# Patient Record
Sex: Male | Born: 1935 | Race: White | Hispanic: No
Health system: Southern US, Community
[De-identification: ages and names within clinical notes are randomized; demographics above are authoritative.]

---

## 1998-02-16 ENCOUNTER — Encounter (HOSPITAL_COMMUNITY): Admission: RE | Admit: 1998-02-16 | Discharge: 1998-05-17 | Payer: Self-pay

## 1998-02-22 ENCOUNTER — Encounter: Admission: RE | Admit: 1998-02-22 | Discharge: 1998-02-22 | Payer: Self-pay | Admitting: Family Medicine

## 1998-03-15 ENCOUNTER — Encounter: Admission: RE | Admit: 1998-03-15 | Discharge: 1998-03-15 | Payer: Self-pay | Admitting: Sports Medicine

## 1998-03-29 ENCOUNTER — Encounter: Admission: RE | Admit: 1998-03-29 | Discharge: 1998-03-29 | Payer: Self-pay | Admitting: Family Medicine

## 1998-05-18 ENCOUNTER — Encounter: Admission: RE | Admit: 1998-05-18 | Discharge: 1998-05-18 | Payer: Self-pay | Admitting: Family Medicine

## 1998-06-01 ENCOUNTER — Encounter: Admission: RE | Admit: 1998-06-01 | Discharge: 1998-06-01 | Payer: Self-pay | Admitting: Family Medicine

## 1998-10-26 ENCOUNTER — Encounter: Admission: RE | Admit: 1998-10-26 | Discharge: 1998-10-26 | Payer: Self-pay | Admitting: Family Medicine

## 1998-11-02 ENCOUNTER — Encounter: Admission: RE | Admit: 1998-11-02 | Discharge: 1998-11-02 | Payer: Self-pay | Admitting: Family Medicine

## 1998-11-21 ENCOUNTER — Encounter: Admission: RE | Admit: 1998-11-21 | Discharge: 1998-11-21 | Payer: Self-pay | Admitting: Family Medicine

## 1998-11-28 ENCOUNTER — Encounter: Admission: RE | Admit: 1998-11-28 | Discharge: 1998-11-28 | Payer: Self-pay | Admitting: Family Medicine

## 2000-06-11 ENCOUNTER — Encounter: Admission: RE | Admit: 2000-06-11 | Discharge: 2000-06-11 | Payer: Self-pay | Admitting: Family Medicine

## 2000-06-26 ENCOUNTER — Encounter: Admission: RE | Admit: 2000-06-26 | Discharge: 2000-06-26 | Payer: Self-pay | Admitting: Family Medicine

## 2000-09-01 ENCOUNTER — Encounter: Payer: Self-pay | Admitting: *Deleted

## 2000-09-02 ENCOUNTER — Inpatient Hospital Stay (HOSPITAL_COMMUNITY): Admission: EM | Admit: 2000-09-02 | Discharge: 2000-09-20 | Payer: Self-pay | Admitting: *Deleted

## 2000-09-06 ENCOUNTER — Encounter: Payer: Self-pay | Admitting: Thoracic Surgery (Cardiothoracic Vascular Surgery)

## 2000-09-10 ENCOUNTER — Encounter: Payer: Self-pay | Admitting: Sports Medicine

## 2000-09-10 ENCOUNTER — Encounter: Payer: Self-pay | Admitting: Thoracic Surgery (Cardiothoracic Vascular Surgery)

## 2000-09-11 ENCOUNTER — Encounter: Payer: Self-pay | Admitting: Thoracic Surgery (Cardiothoracic Vascular Surgery)

## 2000-09-12 ENCOUNTER — Encounter: Payer: Self-pay | Admitting: Thoracic Surgery (Cardiothoracic Vascular Surgery)

## 2000-09-13 ENCOUNTER — Encounter: Payer: Self-pay | Admitting: Thoracic Surgery (Cardiothoracic Vascular Surgery)

## 2001-06-23 ENCOUNTER — Emergency Department (HOSPITAL_COMMUNITY): Admission: EM | Admit: 2001-06-23 | Discharge: 2001-06-23 | Payer: Self-pay | Admitting: Emergency Medicine

## 2001-06-25 ENCOUNTER — Encounter: Admission: RE | Admit: 2001-06-25 | Discharge: 2001-06-25 | Payer: Self-pay | Admitting: Family Medicine

## 2001-07-08 ENCOUNTER — Encounter: Admission: RE | Admit: 2001-07-08 | Discharge: 2001-07-08 | Payer: Self-pay | Admitting: Family Medicine

## 2001-08-11 ENCOUNTER — Encounter: Admission: RE | Admit: 2001-08-11 | Discharge: 2001-08-11 | Payer: Self-pay | Admitting: Family Medicine

## 2001-10-12 ENCOUNTER — Encounter: Admission: RE | Admit: 2001-10-12 | Discharge: 2001-10-12 | Payer: Self-pay | Admitting: Sports Medicine

## 2002-06-28 ENCOUNTER — Encounter: Payer: Self-pay | Admitting: Sports Medicine

## 2002-06-28 ENCOUNTER — Inpatient Hospital Stay (HOSPITAL_COMMUNITY): Admission: AD | Admit: 2002-06-28 | Discharge: 2002-07-04 | Payer: Self-pay | Admitting: Sports Medicine

## 2002-06-28 ENCOUNTER — Encounter: Admission: RE | Admit: 2002-06-28 | Discharge: 2002-06-28 | Payer: Self-pay | Admitting: Family Medicine

## 2002-06-29 ENCOUNTER — Encounter: Payer: Self-pay | Admitting: Cardiology

## 2002-07-19 ENCOUNTER — Encounter: Admission: RE | Admit: 2002-07-19 | Discharge: 2002-07-19 | Payer: Self-pay | Admitting: Family Medicine

## 2002-08-09 ENCOUNTER — Encounter: Admission: RE | Admit: 2002-08-09 | Discharge: 2002-08-09 | Payer: Self-pay | Admitting: Family Medicine

## 2002-08-30 ENCOUNTER — Encounter: Admission: RE | Admit: 2002-08-30 | Discharge: 2002-08-30 | Payer: Self-pay | Admitting: Family Medicine

## 2002-09-19 ENCOUNTER — Encounter (INDEPENDENT_AMBULATORY_CARE_PROVIDER_SITE_OTHER): Payer: Self-pay | Admitting: *Deleted

## 2002-09-19 ENCOUNTER — Ambulatory Visit (HOSPITAL_COMMUNITY): Admission: RE | Admit: 2002-09-19 | Discharge: 2002-09-19 | Payer: Self-pay | Admitting: Gastroenterology

## 2002-10-26 ENCOUNTER — Encounter: Admission: RE | Admit: 2002-10-26 | Discharge: 2002-10-26 | Payer: Self-pay | Admitting: Family Medicine

## 2002-11-28 ENCOUNTER — Encounter: Admission: RE | Admit: 2002-11-28 | Discharge: 2002-11-28 | Payer: Self-pay | Admitting: Family Medicine

## 2002-12-08 ENCOUNTER — Encounter: Admission: RE | Admit: 2002-12-08 | Discharge: 2002-12-08 | Payer: Self-pay | Admitting: Sports Medicine

## 2003-01-10 ENCOUNTER — Encounter: Admission: RE | Admit: 2003-01-10 | Discharge: 2003-01-10 | Payer: Self-pay | Admitting: Family Medicine

## 2003-01-17 ENCOUNTER — Encounter: Admission: RE | Admit: 2003-01-17 | Discharge: 2003-01-17 | Payer: Self-pay | Admitting: Family Medicine

## 2004-06-02 ENCOUNTER — Inpatient Hospital Stay (HOSPITAL_COMMUNITY): Admission: EM | Admit: 2004-06-02 | Discharge: 2004-06-08 | Payer: Self-pay | Admitting: Emergency Medicine

## 2004-06-03 ENCOUNTER — Encounter (INDEPENDENT_AMBULATORY_CARE_PROVIDER_SITE_OTHER): Payer: Self-pay | Admitting: *Deleted

## 2004-06-18 ENCOUNTER — Encounter: Admission: RE | Admit: 2004-06-18 | Discharge: 2004-06-18 | Payer: Self-pay | Admitting: Sports Medicine

## 2004-06-28 ENCOUNTER — Encounter: Admission: RE | Admit: 2004-06-28 | Discharge: 2004-06-28 | Payer: Self-pay | Admitting: Family Medicine

## 2005-09-11 ENCOUNTER — Emergency Department (HOSPITAL_COMMUNITY): Admission: EM | Admit: 2005-09-11 | Discharge: 2005-09-11 | Payer: Self-pay | Admitting: Emergency Medicine

## 2005-09-16 ENCOUNTER — Emergency Department (HOSPITAL_COMMUNITY): Admission: EM | Admit: 2005-09-16 | Discharge: 2005-09-16 | Payer: Self-pay | Admitting: Emergency Medicine

## 2005-09-18 ENCOUNTER — Ambulatory Visit (HOSPITAL_COMMUNITY): Admission: RE | Admit: 2005-09-18 | Discharge: 2005-09-18 | Payer: Self-pay | Admitting: Orthopedic Surgery

## 2006-07-27 ENCOUNTER — Ambulatory Visit: Payer: Self-pay | Admitting: Sports Medicine

## 2006-09-21 ENCOUNTER — Ambulatory Visit: Payer: Self-pay | Admitting: Family Medicine

## 2006-09-30 ENCOUNTER — Ambulatory Visit: Payer: Self-pay | Admitting: Family Medicine

## 2007-08-17 ENCOUNTER — Encounter: Payer: Self-pay | Admitting: Family Medicine

## 2007-08-24 ENCOUNTER — Encounter: Payer: Self-pay | Admitting: Family Medicine

## 2007-08-25 ENCOUNTER — Telehealth: Payer: Self-pay | Admitting: *Deleted

## 2008-02-08 ENCOUNTER — Encounter: Payer: Self-pay | Admitting: Family Medicine

## 2008-08-08 ENCOUNTER — Encounter: Payer: Self-pay | Admitting: Family Medicine

## 2009-09-03 ENCOUNTER — Ambulatory Visit: Payer: Self-pay | Admitting: Family Medicine

## 2009-09-03 DIAGNOSIS — I4891 Unspecified atrial fibrillation: Secondary | ICD-10-CM | POA: Insufficient documentation

## 2009-09-03 DIAGNOSIS — K5909 Other constipation: Secondary | ICD-10-CM | POA: Insufficient documentation

## 2009-09-17 ENCOUNTER — Encounter: Payer: Self-pay | Admitting: Family Medicine

## 2009-12-07 ENCOUNTER — Telehealth: Payer: Self-pay | Admitting: Family Medicine

## 2009-12-07 ENCOUNTER — Ambulatory Visit: Payer: Self-pay | Admitting: Family Medicine

## 2009-12-07 ENCOUNTER — Encounter: Admission: RE | Admit: 2009-12-07 | Discharge: 2009-12-07 | Payer: Self-pay | Admitting: Family Medicine

## 2009-12-27 ENCOUNTER — Telehealth: Payer: Self-pay | Admitting: Family Medicine

## 2010-01-01 ENCOUNTER — Encounter: Payer: Self-pay | Admitting: Family Medicine

## 2010-01-09 ENCOUNTER — Encounter: Payer: Self-pay | Admitting: Family Medicine

## 2010-01-09 ENCOUNTER — Ambulatory Visit: Payer: Self-pay | Admitting: Family Medicine

## 2010-01-09 DIAGNOSIS — R5381 Other malaise: Secondary | ICD-10-CM

## 2010-01-09 DIAGNOSIS — R5383 Other fatigue: Secondary | ICD-10-CM

## 2010-01-11 ENCOUNTER — Telehealth (INDEPENDENT_AMBULATORY_CARE_PROVIDER_SITE_OTHER): Payer: Self-pay | Admitting: *Deleted

## 2010-01-15 ENCOUNTER — Encounter: Payer: Self-pay | Admitting: Family Medicine

## 2010-01-15 DIAGNOSIS — E559 Vitamin D deficiency, unspecified: Secondary | ICD-10-CM | POA: Insufficient documentation

## 2010-01-15 LAB — CONVERTED CEMR LAB
ALT: 12 units/L (ref 0–53)
AST: 16 units/L (ref 0–37)
Albumin: 3.6 g/dL (ref 3.5–5.2)
Alkaline Phosphatase: 69 units/L (ref 39–117)
BUN: 12 mg/dL (ref 6–23)
Basophils Absolute: 0 10*3/uL (ref 0.0–0.1)
Basophils Relative: 0 % (ref 0–1)
CO2: 28 meq/L (ref 19–32)
Calcium: 8.6 mg/dL (ref 8.4–10.5)
Chloride: 104 meq/L (ref 96–112)
Creatinine, Ser: 0.98 mg/dL (ref 0.40–1.50)
Eosinophils Absolute: 0.3 10*3/uL (ref 0.0–0.7)
Eosinophils Relative: 3 % (ref 0–5)
Glucose, Bld: 90 mg/dL (ref 70–99)
HCT: 38.1 % — ABNORMAL LOW (ref 39.0–52.0)
Hemoglobin: 12.2 g/dL — ABNORMAL LOW (ref 13.0–17.0)
Lymphocytes Relative: 15 % (ref 12–46)
Lymphs Abs: 1.5 10*3/uL (ref 0.7–4.0)
MCHC: 32 g/dL (ref 30.0–36.0)
MCV: 98.2 fL (ref 78.0–100.0)
Monocytes Absolute: 0.8 10*3/uL (ref 0.1–1.0)
Monocytes Relative: 9 % (ref 3–12)
Neutro Abs: 7.2 10*3/uL (ref 1.7–7.7)
Neutrophils Relative %: 73 % (ref 43–77)
Platelets: 223 10*3/uL (ref 150–400)
Potassium: 4.6 meq/L (ref 3.5–5.3)
RBC: 3.88 M/uL — ABNORMAL LOW (ref 4.22–5.81)
RDW: 13.5 % (ref 11.5–15.5)
Sodium: 139 meq/L (ref 135–145)
TSH: 2.538 microintl units/mL (ref 0.350–4.500)
Total Bilirubin: 0.3 mg/dL (ref 0.3–1.2)
Total Protein: 6.3 g/dL (ref 6.0–8.3)
Vit D, 25-Hydroxy: <10 ng/mL — ABNORMAL LOW (ref 30–89)
Vitamin B-12: 294 pg/mL (ref 211–911)
WBC: 9.9 10*3/uL (ref 4.0–10.5)

## 2010-02-27 ENCOUNTER — Ambulatory Visit: Payer: Self-pay | Admitting: Family Medicine

## 2010-04-23 ENCOUNTER — Encounter: Payer: Self-pay | Admitting: Family Medicine

## 2010-04-24 ENCOUNTER — Encounter: Payer: Self-pay | Admitting: Family Medicine

## 2010-05-14 ENCOUNTER — Ambulatory Visit: Payer: Self-pay | Admitting: Family Medicine

## 2010-05-31 ENCOUNTER — Telehealth: Payer: Self-pay | Admitting: Family Medicine

## 2010-07-02 ENCOUNTER — Ambulatory Visit: Payer: Self-pay | Admitting: Cardiology

## 2010-07-25 ENCOUNTER — Telehealth: Payer: Self-pay | Admitting: Family Medicine

## 2010-07-26 ENCOUNTER — Ambulatory Visit: Payer: Self-pay | Admitting: Family Medicine

## 2010-07-26 DIAGNOSIS — R142 Eructation: Secondary | ICD-10-CM

## 2010-07-26 DIAGNOSIS — R141 Gas pain: Secondary | ICD-10-CM | POA: Insufficient documentation

## 2010-07-26 DIAGNOSIS — R143 Flatulence: Secondary | ICD-10-CM

## 2010-07-30 ENCOUNTER — Telehealth: Payer: Self-pay | Admitting: Family Medicine

## 2010-08-09 ENCOUNTER — Telehealth: Payer: Self-pay | Admitting: Family Medicine

## 2010-09-13 ENCOUNTER — Inpatient Hospital Stay (HOSPITAL_COMMUNITY): Admission: EM | Admit: 2010-09-13 | Discharge: 2010-09-25 | Payer: Self-pay | Admitting: Emergency Medicine

## 2010-09-13 ENCOUNTER — Ambulatory Visit: Payer: Self-pay | Admitting: Pulmonary Disease

## 2010-09-13 ENCOUNTER — Ambulatory Visit: Payer: Self-pay | Admitting: Cardiology

## 2010-09-20 ENCOUNTER — Ambulatory Visit: Payer: Self-pay | Admitting: Hematology & Oncology

## 2010-09-23 ENCOUNTER — Encounter: Payer: Self-pay | Admitting: Internal Medicine

## 2010-10-17 DEATH — deceased

## 2010-10-30 ENCOUNTER — Ambulatory Visit: Payer: Self-pay | Admitting: Internal Medicine

## 2010-12-07 ENCOUNTER — Encounter: Payer: Self-pay | Admitting: Gastroenterology

## 2010-12-19 NOTE — Assessment & Plan Note (Signed)
Summary: having a problem with bowel movements.   Vital Signs:  Patient profile:   Todd Davies Height:      67.5 inches Weight:      125.3 pounds BMI:     19.40 Temp:     98.3 degrees F oral Pulse rate:   79 / minute BP sitting:   116 / 61  (left arm) Cuff size:   regular  Vitals Entered By: Garen Grams LPN (February 27, 2010 5:04 PM) CC: constipation Is Patient Diabetic? No Pain Assessment Patient in pain? no        Primary Care Provider:  Romero Belling MD  CC:  constipation.  History of Present Illness: 77 yo Davies presents to again be evaluated for persistent constipation.  Is using Miralax two times a day and stools daily, but feels constant pressure as if he has not evacuated completely.  Was referred for colonoscopy and had it done 09/2009 at [facility]--report unavailable, but per son it was normal.  Eats a lot of vegetables, avoids foods that he thinks are constipating.  Habits & Providers  Alcohol-Tobacco-Diet     Tobacco Status: never  Current Medications (verified): 1)  Metamucil Smooth Texture 28 % Pack (Psyllium) .... Take in A Full Glass of Water Twice Daily As Directed On Label (Can Be Bought Over The Counter--Get Store Brand) 2)  Polyethylene Glycol 3350  Powd (Polyethylene Glycol 3350) .Marland KitchenMarland KitchenMarland Kitchen 17 Grams (1 Capful) in 8 Oz Water Two Times A Day X1 Week, Then Once Daily.  Disp Qs X1 Month. 3)  Digoxin 0.25 Mg Tabs (Digoxin) .Marland Kitchen.. 1 Tab By Mouth Daily 4)  Warfarin Sodium 2.5 Mg Tabs (Warfarin Sodium) .Marland Kitchen.. 1 Tab By Mouth Daily 5)  Atenolol 50 Mg Tabs (Atenolol) .... One and A Half Tabs By Mouth Daily 6)  Crestor 10 Mg Tabs (Rosuvastatin Calcium) .Marland Kitchen.. 1 Tab By Mouth Daily 7)  Vitamin D3 1000 Unit Tabs (Cholecalciferol) .Marland Kitchen.. 1 Tab By Mouth Daily X3 Months  Allergies (verified): No Known Drug Allergies  Physical Exam  General:  Well-developed,well-nourished,in no acute distress; alert,appropriate and cooperative throughout examination Abdomen:  Bowel sounds  positive,abdomen soft and non-tender without masses, organomegaly or hernias noted. Rectal:  No external abnormalities noted. Normal sphincter tone. No rectal masses or tenderness.   Impression & Recommendations:  Problem # 1:  CONSTIPATION, CHRONIC (ICD-564.09) Have added Metamucil for bulk.  Advised high fiber diet.  Have sent for colonoscopy records. His updated medication list for this problem includes:    Metamucil Smooth Texture 28 % Pack (Psyllium) .Marland Kitchen... Take in a full glass of water twice daily as directed on label (can be bought over the counter--get store brand)    Polyethylene Glycol 3350 Powd (Polyethylene glycol 3350) .Marland KitchenMarland KitchenMarland KitchenMarland Kitchen 17 grams (1 capful) in 8 oz water two times a day x1 week, then once daily.  disp qs x1 month.  Orders: [facility]- Est Level  3 (69629)  Complete Medication List: 1)  Metamucil Smooth Texture 28 % Pack (Psyllium) .... Take in a full glass of water twice daily as directed on label (can be bought over the counter--get store brand) 2)  Polyethylene Glycol 3350 Powd (Polyethylene glycol 3350) .Marland KitchenMarland KitchenMarland Kitchen 17 grams (1 capful) in 8 oz water two times a day x1 week, then once daily.  disp qs x1 month. 3)  Digoxin 0.25 Mg Tabs (Digoxin) .Marland Kitchen.. 1 tab by mouth daily 4)  Warfarin Sodium 2.5 Mg Tabs (Warfarin sodium) .Marland Kitchen.. 1 tab by mouth daily 5)  Atenolol  50 Mg Tabs (Atenolol) .... One and a half tabs by mouth daily 6)  Crestor 10 Mg Tabs (Rosuvastatin calcium) .Marland Kitchen.. 1 tab by mouth daily 7)  Vitamin D3 1000 Unit Tabs (Cholecalciferol) .Marland Kitchen.. 1 tab by mouth daily x3 months  Patient Instructions: 1)  Good to see you today. 2)  Keep taking the Polyethylene Glycol in water two times a day, this will keep your bowels moving. 3)  Start taking Miralax (store brand) in water two times a day, this will give bulk to your stools. 4)  Please schedule a follow-up appointment in 2 weeks if not better.

## 2010-12-19 NOTE — Progress Notes (Signed)
Summary: Please Call About Refill   Phone Note Outgoing Call   Summary of Call: To Triage RN.  Mr. Solum' pharmacy sent an electronic refill request for Avelox.  He was originally prescribed a 7-day course of this on 01/21 for suspected pneumonia.  I have denied this refill, but am concerned that his request may indicate that he is still not well.  Please call to see if he needs an appointment.  If he reports any of the following, please schedule him for an appointment:  fever, dyspnea, chest pain, fatigue, or any other symptom concerning to Triage RN.  Thank you. Initial call taken by: Romero Belling MD,  December 27, 2009 8:34 PM  Follow-up for Phone Call        left message Follow-up by: Golden Circle RN,  December 28, 2009 8:50 AM  Additional Follow-up for Phone Call Additional follow up Details #1::        left message Additional Follow-up by: Golden Circle RN,  December 28, 2009 11:49 AM    Additional Follow-up for Phone Call Additional follow up Details #2::    left message Follow-up by: Golden Circle RN,  December 31, 2009 11:54 AM

## 2010-12-19 NOTE — Consult Note (Signed)
Summary: [facility]  [facility]   Imported By: Clydell Hakim 01/18/2010 11:50:51  _____________________________________________________________________  External Attachment:    Type:   Image     Comment:   External Document

## 2010-12-19 NOTE — Progress Notes (Signed)
Summary: triage   Phone Note Call from Patient Call back at Home Phone [phone]   Caller: Patient Summary of Call: hasn't had BM in a while and wants to come in today Initial call taken by: De Nurse,  May 31, 2010 10:03 AM  Follow-up for Phone Call        LM. he has been here for this problem in the last few weeks Follow-up by: Golden Circle RN,  May 31, 2010 10:07 AM  Additional Follow-up for Phone Call Additional follow up Details #1::        LM Additional Follow-up by: Golden Circle RN,  May 31, 2010 4:29 PM    Additional Follow-up for Phone Call Additional follow up Details #2::    LM that if he still needed [place], to call back Follow-up by: Golden Circle RN,  June 03, 2010 9:58 AM

## 2010-12-19 NOTE — Letter (Signed)
Summary: Vitamin D Deficiency  [facility]  [address]   [place], [place] [address]   Phone: [phone]  Fax: [phone]    01/15/2010  ENRIQUE MANGANARO [address] [place]  [address]  Dear Mr. Ebarb,  I have reviewed your labs, and you have very low vitamin D.  This is important for bone health.  I would like you to take the supplement I have prescribed (enclosed with letter) for 3 months.  Then we'll recheck your vitamin D level.  Sincerely,   Romero Belling MD

## 2010-12-19 NOTE — Consult Note (Signed)
Summary: [facility]   Imported By: Clydell Hakim 01/14/2010 16:47:25  _____________________________________________________________________  External Attachment:    Type:   Image     Comment:   External Document

## 2010-12-19 NOTE — Progress Notes (Signed)
Summary: meds prob   Phone Note Call from Patient Call back at [phone]   Caller: son-Ken Summary of Call: having burning w/ bowel movements and wants to know if there is anything he can have besides the powder he is using ( thinks this is a side affect) [employer] Initial call taken by: De Nurse,  January 11, 2010 3:45 PM  Follow-up for Phone Call        Miralax shouldn't be causing burning.  He needs to be evaluated. Follow-up by: Romero Belling MD,  January 11, 2010 5:02 PM  Additional Follow-up for Phone Call Additional follow up Details #1::        [facility] to callback with questions or to make an appt. Additional Follow-up by: Jone Baseman CMA,  January 14, 2010 10:37 AM    Additional Follow-up for Phone Call Additional follow up Details #2::    Should probably been seen by [facility]--they saw him late last year and worked with him on this problem. Follow-up by: Romero Belling MD,  January 15, 2010 12:40 PM

## 2010-12-19 NOTE — Miscellaneous (Signed)
Summary: Request for [facility] Records  BLUE TEAM:  Please get [name]' records from [facility].  I have partial records from his November 2010 colonoscopy, but no indication of when they want to see him for follow up.  Thanks.  Romero Belling MD  April 23, 2010 2:11 PM  Per RN @ [facility] routine f/u to colonoscopy was recommended in 2 months and pt never made.  Other than that no f/u is recommended 2/2 patients age. Pt may call to schedule routine f/u ............................................... Delora Fuel April 23, 2010 3:26 PM

## 2010-12-19 NOTE — Progress Notes (Signed)
Summary: triage   Phone Note Call from Patient Call back at Home Phone [phone]   Caller: Patient Summary of Call: Pt having stomach spasms and asking to be seen today. Initial call taken by: Clydell Hakim,  August 09, 2010 9:28 AM  Follow-up for Phone Call        we have no appts left. per last entry, he had been told to call his GI md. told him to call there & gave the md name. states he will Follow-up by: Golden Circle RN,  August 09, 2010 9:51 AM

## 2010-12-19 NOTE — Assessment & Plan Note (Signed)
Summary: fatigue   Vital Signs:  Patient profile:   77 year old male Height:      67.5 inches Weight:      126 pounds BMI:     19.51 BSA:     1.67 O2 Sat:      95 % on Room air Temp:     98.4 degrees F Pulse rate:   60 / minute BP sitting:   129 / 65  Vitals Entered By: Jone Baseman CMA (January 09, 2010 1:37 PM)  O2 Flow:  Room air CC: fatigue Is Patient Diabetic? No Pain Assessment Patient in pain? no        Primary Care Provider:  Romero Belling MD  CC:  fatigue.  History of Present Illness: 77 yo male with fatigue and decreased appetite since that has persisted since he was seen in clinic on 01/21 and diagnosed with Pneumonia and treated with Avelox.  Today, he denies dypsnea, fever, cough, wheeze, chest pain, dysuria, vomiting, nausea.  Is chronically constipated, but had 1x episode of diarrhea yesterday after taking too much Miralax.  10 pound weight loss since October.  Habits & Providers  Alcohol-Tobacco-Diet     Tobacco Status: never  Current Medications (verified): 1)  Digoxin 0.25 Mg Tabs (Digoxin) .Marland Kitchen.. 1 Tab By Mouth Daily 2)  Warfarin Sodium 2.5 Mg Tabs (Warfarin Sodium) .Marland Kitchen.. 1 Tab By Mouth Daily 3)  Atenolol 50 Mg Tabs (Atenolol) .... One and A Half Tabs By Mouth Daily 4)  Crestor 10 Mg Tabs (Rosuvastatin Calcium) .Marland Kitchen.. 1 Tab By Mouth Daily 5)  Polyethylene Glycol 3350  Powd (Polyethylene Glycol 3350) .Marland KitchenMarland KitchenMarland Kitchen 17 Grams (1 Capful) in 8 Oz Water Two Times A Day X1 Week, Then Once Daily.  Disp Qs X1 Month. 6)  Avelox 400 Mg Tabs (Moxifloxacin Hcl) .... One Tab Daily For 7 Days  Allergies (verified): No Known Drug Allergies  Social History: Smoking Status:  never  Physical Exam  Additional Exam:  VITALS:  Reviewed, afebrile, normal SpO2 GEN: Alert & oriented, weak-appearing NECK: Midline trachea, no masses/thyromegaly, bilateral nontender cervical lymphadenopathy CARDIO: **Regular** rate and rhythm, no murmurs/rubs/gallops, 2+ bilateral radial  pulses RESP: Clear to auscultation, normal work of breathing, no retractions/accessory muscle use ABD: Normoactive bowel sounds, nontender, no masses/hepatosplenomegaly EXT: Nontender, no edema EYES:  Vision grossly normal, LEFT subconjunctival hemorrhage, EOMI, PERRL   Impression & Recommendations:  Problem # 1:  FATIGUE (ICD-780.79) Assessment New No signs of pneumonia today.  Will check labs for fatigue workup:  CMet, CBC with diff, TSH, Vit B12, Vit D.  Had cardiology office visit this week with Dr. Sw[place]il[place], which he says was "clean bill of health."  Will request cards records.  Orders: Comp Met-[facility] [phone]) CBC w/Diff-[facility] [phone]) B12-[facility] [phone]) Vit D, 25 [place]-[facility] ([id number]) TSH-[facility] ([id number]) [facility]- Est  Level 4 (24401)  Problem # 2:  CONSTIPATION, CHRONIC (ICD-564.09) Assessment: Improved  Somewhat improved with miralax, though he hasn't been eating regularly for the past few weeks.  Had colonoscopy in November--normal per patient, will request records. His updated medication list for this problem includes:    Polyethylene Glycol 3350 Powd (Polyethylene glycol 3350) .Marland KitchenMarland KitchenMarland KitchenMarland Kitchen 17 grams (1 capful) in 8 oz water two times a day x1 week, then once daily.  disp qs x1 month.  Orders: [facility]- Est  Level 4 (02725)  Complete Medication List: 1)  Digoxin 0.25 Mg Tabs (Digoxin) .Marland Kitchen.. 1 tab by mouth daily 2)  Warfarin Sodium 2.5 Mg Tabs (Warfarin sodium) .Marland Kitchen.. 1 tab by  mouth daily 3)  Atenolol 50 Mg Tabs (Atenolol) .... One and a half tabs by mouth daily 4)  Crestor 10 Mg Tabs (Rosuvastatin calcium) .Marland Kitchen.. 1 tab by mouth daily 5)  Polyethylene Glycol 3350 Powd (Polyethylene glycol 3350) .Marland KitchenMarland KitchenMarland Kitchen 17 grams (1 capful) in 8 oz water two times a day x1 week, then once daily.  disp qs x1 month. 6)  Avelox 400 Mg Tabs (Moxifloxacin hcl) .... One tab daily for 7 days  Other Orders: Pulse Oximetry- [facility] [phone])  Patient Instructions: 1)  Good to see you today.  This is not pneumonia, but I  am concerned about your low energy.  I have drawn a number of labs and will let you know the results tomorrow.

## 2010-12-19 NOTE — Assessment & Plan Note (Signed)
Summary: constipation,tcb   Vital Signs:  Patient profile:   77 year old male Height:      67.5 inches Weight:      119 pounds BMI:     18.43 BSA:     1.63 Temp:     98.8 degrees F Pulse rate:   79 / minute BP sitting:   108 / 63  Vitals Entered By: Jone Baseman CMA (May 14, 2010 3:06 PM) CC: constipation x 1 week Is Patient Diabetic? No Pain Assessment Patient in pain? no        Primary Care Provider:  Romero Belling MD  CC:  constipation x 1 week.  History of Present Illness: 1) Constipation: History of chronic constipation. Takes metamucil, miralax once a day. Notes lack of bowel movement for past 5-6 days. Reports occasional "stomach spasms". Denies nausea, emesis, diarrhea, melena, hematochezia, rectal bleeding or pain, fever, appetite change, weight change    Habits & Providers  Alcohol-Tobacco-Diet     Tobacco Status: never  Current Medications (verified): 1)  Metamucil Smooth Texture 28 % Pack (Psyllium) .... Take in A Full Glass of Water Twice Daily As Directed On Label (Can Be Bought Over The Counter--Get Store Brand) 2)  Polyethylene Glycol 3350  Powd (Polyethylene Glycol 3350) .Marland KitchenMarland KitchenMarland Kitchen 17 Grams (1 Capful) in 8 Oz Water Two Times A Day X1 Week, Then Once Daily.  Disp Qs X1 Month. 3)  Digoxin 0.25 Mg Tabs (Digoxin) .Marland Kitchen.. 1 Tab By Mouth Daily 4)  Warfarin Sodium 2.5 Mg Tabs (Warfarin Sodium) .Marland Kitchen.. 1 Tab By Mouth Daily 5)  Atenolol 50 Mg Tabs (Atenolol) .... One and A Half Tabs By Mouth Daily 6)  Crestor 10 Mg Tabs (Rosuvastatin Calcium) .Marland Kitchen.. 1 Tab By Mouth Daily 7)  Vitamin D3 1000 Unit Tabs (Cholecalciferol) .Marland Kitchen.. 1 Tab By Mouth Daily X3 Months 8)  Colace 100 Mg Caps (Docusate Sodium) .... One Tab By Mouth Qday As Needed For Constipation 9)  Senna 8.6 Mg Tabs (Sennosides) .... One Tab By Mouth Qday As Needed Constipation  Allergies (verified): No Known Drug Allergies  Review of Systems       see above otherwise negative   Physical Exam  General:  elderly,  pleasant male, NAD  Mouth:  moist membranes  Abdomen:  soft, mild diffuse tenderness to palpation, non distended, +BS, no palpable masses, no rebound or guarding  Rectal:  No external abnormalities noted. Normal sphincter tone. No rectal masses or tenderness. Small amount of stool in rectal vault. Heme negative stool.    Impression & Recommendations:  Problem # 1:  CONSTIPATION, CHRONIC (ICD-564.09) Assessment Deteriorated  Will increase Miralax to two times a day. Will add Colace and Senna until improved. Goal 20 grams fiber per day (food list given). Continue Metamucil. Increase H2O intake. Heme negative stool. No red flags on exam. Advised regarding red flags that would prompt return to care. Patient does not wish to proceed with enema at this time - would stongly consider this route of treatment if no improvement.   His updated medication list for this problem includes:    Metamucil Smooth Texture 28 % Pack (Psyllium) .Marland Kitchen... Take in a full glass of water twice daily as directed on label (can be bought over the counter--get store brand)    Polyethylene Glycol 3350 Powd (Polyethylene glycol 3350) .Marland KitchenMarland KitchenMarland KitchenMarland Kitchen 17 grams (1 capful) in 8 oz water two times a day x1 week, then once daily.  disp qs x1 month.    Colace 100 Mg Caps (Docusate  sodium) ..... One tab by mouth qday as needed for constipation    Senna 8.6 Mg Tabs (Sennosides) ..... One tab by mouth qday as needed constipation  Orders: [facility]- Est Level  3 ([address]21)  Complete Medication List: 1)  Metamucil Smooth Texture 28 % Pack (Psyllium) .... Take in a full glass of water twice daily as directed on label (can be bought over the counter--get store brand) 2)  Polyethylene Glycol 3350 Powd (Polyethylene glycol 3350) .Marland KitchenMarland KitchenMarland Kitchen 17 grams (1 capful) in 8 oz water two times a day x1 week, then once daily.  disp qs x1 month. 3)  Digoxin 0.25 Mg Tabs (Digoxin) .Marland Kitchen.. 1 tab by mouth daily 4)  Warfarin Sodium 2.5 Mg Tabs (Warfarin sodium) .Marland Kitchen.. 1 tab by mouth  daily 5)  Atenolol 50 Mg Tabs (Atenolol) .... One and a half tabs by mouth daily 6)  Crestor 10 Mg Tabs (Rosuvastatin calcium) .Marland Kitchen.. 1 tab by mouth daily 7)  Vitamin D3 1000 Unit Tabs (Cholecalciferol) .Marland Kitchen.. 1 tab by mouth daily x3 months 8)  Colace 100 Mg Caps (Docusate sodium) .... One tab by mouth qday as needed for constipation 9)  Senna 8.6 Mg Tabs (Sennosides) .... One tab by mouth qday as needed constipation  Patient Instructions: 1)  It was great to see you today!  2)  Drink more water - try to get 6 glasses a day 3)  Take Colace as directed for constipation. 4)  Take Senna as directed for constipation. 5)  Eat food from the fiber list - get 20 grams of fiber per day 6)  Follow up in 2-3 days if no improvement. 7)  If you start having worse belly pain, throwing up or fevers come in sooner.  8)  You can use the Miralax twice a day as well.  Prescriptions: SENNA 8.6 MG TABS (SENNOSIDES) one tab by mouth qday as needed constipation  #30 x 3   Entered and Authorized by:   Bobby Rumpf  MD   Signed by:   Bobby Rumpf  MD on 05/14/2010   Method used:   [facility]ically [facility]        [facility]       [address]       [place], [place]  [address]       Ph: [phone]       Fax: [phone]   RxID:   [id number] COLACE 100 MG CAPS (DOCUSATE SODIUM) one tab by mouth qday as needed for constipation  #30 x 3   Entered and Authorized by:   Bobby Rumpf  MD   Signed by:   Bobby Rumpf  MD on 05/14/2010   Method used:   Electronically to        [facility]       [address]       [place], [place]  [address]       Ph: [phone]       Fax: [phone]   RxID:   [id number]

## 2010-12-19 NOTE — Miscellaneous (Signed)
Summary: Chart Summary  2 main problems:  - A-fib, regular follow up with Dr. Swaziland.  - Chronic constipation.  Follows with me on this one.  Colonoscopy in November 2010 showed the following, though documentation did not indicate when next colonoscopy should be performed.  Based on 2006  Consensus update by the [place] Multi-Society Task Force on Colorectal Cancer and the American Cancer Society indicates follow up should be sometime in 3-5 years.  Will send for records from [place] to verify when patient should be seen for followup.     - Tubular adenoma     - Three fragments of hyperplastic polyps     - High grade dysplasia is NOT identified

## 2010-12-19 NOTE — Progress Notes (Signed)
Summary: triage   Phone Note Call from Patient Call back at [phone] or [phone]   Caller: son-Kenneth Summary of Call: Pt having stomach spasms and needs to be seen tomorrow. Initial call taken by: Clydell Hakim,  July 30, 2010 4:06 PM  Follow-up for Phone Call        son called to make him an appt for his c/o stomach spasms. told him he had just been seen for this & pt is to see his GI md if no relief. son states he will call Dr. Ewing Schlein Follow-up by: Golden Circle RN,  July 30, 2010 4:10 PM

## 2010-12-19 NOTE — Miscellaneous (Signed)
Summary: Device preload  Clinical Lists Changes  Observations: Added new observation of PPM INDICATN: Sick sinus syndrome (09/23/2010 8:51) Added new observation of MAGNET RTE: BOL 85 ERI 65 (09/23/2010 8:51) Added new observation of PPMLEADSTAT2: active (09/23/2010 8:51) Added new observation of PPMLEADSER2: [id number] (09/23/2010 8:51) Added new observation of PPMLEADMOD2: 5076  (09/23/2010 8:51) Added new observation of PPMLEADDOI2: 06/07/2004  (09/23/2010 8:51) Added new observation of PPMLEADLOC2: RV  (09/23/2010 8:51) Added new observation of PPMLEADSTAT1: active  (09/23/2010 8:51) Added new observation of PPMLEADSER1: [id number]  (09/23/2010 8:51) Added new observation of PPMLEADMOD1: 5076  (09/23/2010 8:51) Added new observation of PPMLEADDOI1: 06/07/2004  (09/23/2010 8:51) Added new observation of PPMLEADLOC1: RA  (09/23/2010 8:51) Added new observation of PPM DOI: 06/07/2004  (09/23/2010 8:51) Added new observation of PPM SERL#: [id number]  (09/23/2010 8:51) Added new observation of PPM MODL#: [id number]  (09/23/2010 4:69) Added new observation of PACEMAKERMFG: [employer]onic  (09/23/2010 8:51) Added new observation of PPM IMP MD: Duffy Rhody Tennant,MD  (09/23/2010 8:51) Added new observation of PPM REFER MD: Vonna Drafts  (09/23/2010 8:51) Added new observation of PACEMAKER MD: Hillis Range, MD  (09/23/2010 8:51)      PPM Specifications Following MD:  Hillis Range, MD     Referring MD:  Vonna Drafts PPM Vendor:  [employer]onic     PPM Model Number:  [id number]     PPM Serial Number:  [id number] PPM DOI:  06/07/2004     PPM Implanting MD:  Rolla Plate  Lead 1    Location: RA     DOI: 06/07/2004     Model #: [id number]     Serial #: [id number]     Status: active Lead 2    Location: RV     DOI: 06/07/2004     Model #: [id number]     Serial #: [id number]     Status: active  Magnet Response Rate:  BOL 85 ERI 65  Indications:  Sick sinus syndrome

## 2010-12-19 NOTE — Progress Notes (Signed)
Summary: triage   Phone Note Call from Patient Call back at Home Phone [phone]   Caller: Patient Summary of Call: having stomach spasms and wants to know what he can take Initial call taken by: De Nurse,  July 25, 2010 12:11 PM  Follow-up for Phone Call        LM Follow-up by: Golden Circle RN,  July 25, 2010 12:16 PM  Additional Follow-up for Phone Call Additional follow up Details #1::        pt returned call Additional Follow-up by: De Nurse,  July 25, 2010 1:36 PM    Additional Follow-up for Phone Call Additional follow up Details #2::    I spoke with the son. I could hear pt in the backround answering questions. states they are in the middle of the stomach @ the umbilicus. pain is 5/10 & worse with stooling. no other symptoms. advised tylenol until seen. appt at 10:15 tomrrow at his request Follow-up by: Golden Circle RN,  July 25, 2010 1:50 PM

## 2010-12-19 NOTE — Letter (Signed)
Summary: Patient needs to make [facility] appointment  [facility]  [address]   [place], [place] [address]   Phone: [phone]  Fax: [phone]    04/24/2010  BRANDIS MATSUURA [address], [place]  [address]  Dear Mr. Thomaston,  My office spoke with [facility] to discuss the colonoscopy you had there in November.  They indicated that they had wanted you to follow up 2 months after that, but that you had not seen them since.  I recommend that you call the [facility] office to make a follow up appointment with them this month.  Sincerely, Romero Belling MD  Appended Document: Patient needs to make [facility] GI appointment mailed

## 2010-12-19 NOTE — Progress Notes (Signed)
   Pt's son called because he was seen today and Rx was supposed to be called in to [facility] for Avelox.  Checking Kristin Bruins note, Rx for Avelox was E ordered.  I called it in for pt.  Emmery Seiler MD  December 07, 2009 7:34 PM

## 2010-12-19 NOTE — Assessment & Plan Note (Signed)
Summary: chills/virus,tcb   Vital Signs:  Patient profile:   77 year old male Weight:      129.3 pounds Temp:     100 degrees F oral Pulse rate:   81 / minute BP sitting:   97 / 54  (left arm)  Vitals Entered By: Renato Battles slade,cma CC: productive cough, fever, chills and body aches x > 1 week Is Patient Diabetic? No Pain Assessment Patient in pain? no        CC:  productive cough, fever, and chills and body aches x > 1 week.  Habits & Providers  Alcohol-Tobacco-Diet     Tobacco Status: quit  Social History: Smoking Status:  quit   Impression & Recommendations:  Problem # 1:  PNEUMONIA (ICD-486) Suspected pneumonia based on clinical exam but CXR was not confirmative. His updated medication list for this problem includes:    Avelox 400 Mg Tabs (Moxifloxacin hcl) ..... One tab daily for 7 days  Orders: [facility]- Est Level  3 ([address]) Radiology other (Radiology Other)  Complete Medication List: 1)  Digoxin 0.25 Mg Tabs (Digoxin) .Marland Kitchen.. 1 tab by mouth daily 2)  Warfarin Sodium 2.5 Mg Tabs (Warfarin sodium) .Marland Kitchen.. 1 tab by mouth daily 3)  Atenolol 50 Mg Tabs (Atenolol) .... One and a half tabs by mouth daily 4)  Crestor 10 Mg Tabs (Rosuvastatin calcium) .Marland Kitchen.. 1 tab by mouth daily 5)  Polyethylene Glycol 3350 Powd (Polyethylene glycol 3350) .Marland KitchenMarland KitchenMarland Kitchen 17 grams (1 capful) in 8 oz water two times a day x1 week, then once daily.  disp qs x1 month. 6)  Avelox 400 Mg Tabs (Moxifloxacin hcl) .... One tab daily for 7 days  Patient Instructions: 1)  Begin antibiotic tonight 2)  Drink a lot of fluids 3)  CXR 4)  Coumadin check next week, take you med with you 5)  6 weeks recheck Prescriptions: AVELOX 400 MG TABS (MOXIFLOXACIN HCL) one tab daily for 7 days  #7 x 0   Entered and Authorized by:   Luretha Murphy NP   Signed by:   Luretha Murphy NP on 12/10/2009   Method used:   Electronically to        [facility]       [address]       [place], [place]  [address]       Ph: [phone]       Fax: [phone]   RxID:   [id number]

## 2010-12-19 NOTE — Assessment & Plan Note (Signed)
Summary: "stomach spasms"/[place]/spiegel   Vital Signs:  Patient profile:   77 year old male Height:      67.5 inches Weight:      118.6 pounds BMI:     18.37 Temp:     99.1 degrees F oral Pulse rate:   78 / minute BP sitting:   84 / 53  (left arm) Cuff size:   regular  Vitals Entered By: Garen Grams LPN (July 26, 2010 10:19 AM) CC: stomach "spasms" x 3-4 days Is Patient Diabetic? No   Primary Care Provider:  Ellery Plunk MD  CC:  stomach "spasms" x 3-4 days.  History of Present Illness: Abdominal Pain: Pt has abdominal pain that comes in waves. He says it is "spasms" in his stomach but points to all over thie abdomen when it happens. He says it takes his breath away. He is having normal stools. He is not eating well but when he does eat he says that he stools 2-3 times a day. He is taking several medications to help him stool normally as he does have a problem with constipation. He had a colonoscopy by Dr. Kathy Breach and returned to his clinic after 2 months. Pt and his son say that everything was normal. No diarrhea, no constipation, no fevers or chills, no vomiting, no blood in stool, some belching. Pt says he is having normal gas. H/o abdominal/intestional surgery in the past.   Habits & Providers  Alcohol-Tobacco-Diet     Tobacco Status: never  Allergies: No Known Drug Allergies  Review of Systems        some decrease in appetite, vitals reviewed and pertinent negatives and positives seen in HPI   Physical Exam  General:  Well-developed,well-nourished,in no acute distress; alert,appropriate and cooperative throughout examination Abdomen:  slightly ridged, mildly hyperactive BS, thin, scars noted on abdomen.    Impression & Recommendations:  Problem # 1:  FLATULENCE ERUCTATION AND GAS PAIN (ICD-787.3) Assessment Deteriorated It sounds as if the patient is having gas pain. His son thinks that is what he is describing as well. Plan to start Simethicone and because  the patient is having some nausea I will Rx Zofran.   Orders: [facility]- Est Level  3 ([address])  Complete Medication List: 1)  Metamucil Smooth Texture 28 % Pack (Psyllium) .... Take in a full glass of water twice daily as directed on label (can be bought over the counter--get store brand) 2)  Polyethylene Glycol 3350 Powd (Polyethylene glycol 3350) .Marland KitchenMarland KitchenMarland Kitchen 17 grams (1 capful) in 8 oz water two times a day x1 week, then once daily.  disp qs x1 month. 3)  Digoxin 0.25 Mg Tabs (Digoxin) .Marland Kitchen.. 1 tab by mouth daily 4)  Warfarin Sodium 2.5 Mg Tabs (Warfarin sodium) .Marland Kitchen.. 1 tab by mouth daily 5)  Atenolol 50 Mg Tabs (Atenolol) .... One and a half tabs by mouth daily 6)  Crestor 10 Mg Tabs (Rosuvastatin calcium) .Marland Kitchen.. 1 tab by mouth daily 7)  Vitamin D3 1000 Unit Tabs (Cholecalciferol) .Marland Kitchen.. 1 tab by mouth daily x3 months 8)  Colace 100 Mg Caps (Docusate sodium) .... One tab by mouth qday as needed for constipation 9)  Senna 8.6 Mg Tabs (Sennosides) .... One tab by mouth qday as needed constipation 10)  Simethicone 40 Mg/0.24ml Susp (Simethicone) .... Take 1.2 ml (the phramacy should give a measuring device) of the liquid after meals and at bedtime.  1 large bottle 11)  Ondansetron Hcl 4 Mg Tabs (Ondansetron hcl) .... Take 1  pill every 6 hours as needed for nausea  Patient Instructions: 1)  Take the Simethicone after meals to ease the gas pains.  2)  Use the Zofran to help with the nausea.  3)  If you are having continued stomach pains call Dr. Kathy Breach to make an appointment.  Prescriptions: ONDANSETRON HCL 4 MG TABS (ONDANSETRON HCL) take 1 pill every 6 hours as needed for nausea  #45 x 1   Entered and Authorized by:   Jamie Brookes MD   Signed by:   Jamie Brookes MD on 07/26/2010   Method used:   Electronically to        [facility]       [address]       [place], [place]  [address]       Ph: [phone]       Fax: [phone]   RxID:    [id number] SIMETHICONE 40 MG/0.6ML SUSP (SIMETHICONE) take 1.2 ml (the phramacy should give a measuring device) of the liquid after meals and at bedtime.  1 large bottle  #1 x 1   Entered and Authorized by:   Jamie Brookes MD   Signed by:   Jamie Brookes MD on 07/26/2010   Method used:   Electronically to        [facility]       [address]       [place], [place]  [address]       Ph: [phone]       Fax: [phone]   RxID:   [id number]

## 2011-01-28 LAB — CBC
HCT: 27.2 % — ABNORMAL LOW (ref 39.0–52.0)
HCT: 28.1 % — ABNORMAL LOW (ref 39.0–52.0)
HCT: 33.9 % — ABNORMAL LOW (ref 39.0–52.0)
HCT: 21.3 % — ABNORMAL LOW (ref 39.0–52.0)
Hemoglobin: 8 g/dL — ABNORMAL LOW (ref 13.0–17.0)
Hemoglobin: 9 g/dL — ABNORMAL LOW (ref 13.0–17.0)
Hemoglobin: 7.2 g/dL — ABNORMAL LOW (ref 13.0–17.0)
MCH: 30.5 pg (ref 26.0–34.0)
MCHC: 33.6 g/dL (ref 30.0–36.0)
MCHC: 33.8 g/dL (ref 30.0–36.0)
MCHC: 33.9 g/dL (ref 30.0–36.0)
MCV: 87.4 fL (ref 78.0–100.0)
MCV: 87.8 fL (ref 78.0–100.0)
MCV: 88.9 fL (ref 78.0–100.0)
MCV: 90 fL (ref 78.0–100.0)
Platelets: 149 10*3/uL — ABNORMAL LOW (ref 150–400)
Platelets: 154 10*3/uL (ref 150–400)
Platelets: 202 10*3/uL (ref 150–400)
Platelets: 225 10*3/uL (ref 150–400)
RBC: 3.06 MIL/uL — ABNORMAL LOW (ref 4.22–5.81)
RBC: 3.2 MIL/uL — ABNORMAL LOW (ref 4.22–5.81)
RBC: 3.23 MIL/uL — ABNORMAL LOW (ref 4.22–5.81)
RDW: 17.2 % — ABNORMAL HIGH (ref 11.5–15.5)
RDW: 20 % — ABNORMAL HIGH (ref 11.5–15.5)
RDW: 18.5 % — ABNORMAL HIGH (ref 11.5–15.5)
WBC: 11 10*3/uL — ABNORMAL HIGH (ref 4.0–10.5)
WBC: 11 10*3/uL — ABNORMAL HIGH (ref 4.0–10.5)
WBC: 8.3 10*3/uL (ref 4.0–10.5)
WBC: 9.2 10*3/uL (ref 4.0–10.5)

## 2011-01-28 LAB — TYPE AND SCREEN
Unit division: 0
Unit division: 0

## 2011-01-28 LAB — GLUCOSE, CAPILLARY
Glucose-Capillary: 100 mg/dL — ABNORMAL HIGH (ref 70–99)
Glucose-Capillary: 109 mg/dL — ABNORMAL HIGH (ref 70–99)
Glucose-Capillary: 115 mg/dL — ABNORMAL HIGH (ref 70–99)
Glucose-Capillary: 117 mg/dL — ABNORMAL HIGH (ref 70–99)
Glucose-Capillary: 119 mg/dL — ABNORMAL HIGH (ref 70–99)
Glucose-Capillary: 121 mg/dL — ABNORMAL HIGH (ref 70–99)
Glucose-Capillary: 123 mg/dL — ABNORMAL HIGH (ref 70–99)
Glucose-Capillary: 128 mg/dL — ABNORMAL HIGH (ref 70–99)
Glucose-Capillary: 136 mg/dL — ABNORMAL HIGH (ref 70–99)
Glucose-Capillary: 140 mg/dL — ABNORMAL HIGH (ref 70–99)
Glucose-Capillary: 148 mg/dL — ABNORMAL HIGH (ref 70–99)
Glucose-Capillary: 63 mg/dL — ABNORMAL LOW (ref 70–99)
Glucose-Capillary: 79 mg/dL (ref 70–99)
Glucose-Capillary: 83 mg/dL (ref 70–99)
Glucose-Capillary: 87 mg/dL (ref 70–99)
Glucose-Capillary: 92 mg/dL (ref 70–99)
Glucose-Capillary: 96 mg/dL (ref 70–99)

## 2011-01-28 LAB — PROTIME-INR
INR: 1.45 (ref 0.00–1.49)
INR: 1.84 — ABNORMAL HIGH (ref 0.00–1.49)
INR: 1.87 — ABNORMAL HIGH (ref 0.00–1.49)
Prothrombin Time: 21.4 seconds — ABNORMAL HIGH (ref 11.6–15.2)
Prothrombin Time: 28.5 seconds — ABNORMAL HIGH (ref 11.6–15.2)

## 2011-01-28 LAB — PREPARE FRESH FROZEN PLASMA: Unit division: 0

## 2011-01-28 LAB — COMPREHENSIVE METABOLIC PANEL
ALT: 88 U/L — ABNORMAL HIGH (ref 0–53)
AST: 117 U/L — ABNORMAL HIGH (ref 0–37)
AST: 158 U/L — ABNORMAL HIGH (ref 0–37)
Albumin: 1.5 g/dL — ABNORMAL LOW (ref 3.5–5.2)
Albumin: 1.6 g/dL — ABNORMAL LOW (ref 3.5–5.2)
Albumin: 1.7 g/dL — ABNORMAL LOW (ref 3.5–5.2)
Alkaline Phosphatase: 1299 U/L — ABNORMAL HIGH (ref 39–117)
Alkaline Phosphatase: 1454 U/L — ABNORMAL HIGH (ref 39–117)
BUN: 9 mg/dL (ref 6–23)
Calcium: 7.9 mg/dL — ABNORMAL LOW (ref 8.4–10.5)
Calcium: 8.1 mg/dL — ABNORMAL LOW (ref 8.4–10.5)
Chloride: 108 mEq/L (ref 96–112)
Chloride: 109 mEq/L (ref 96–112)
Chloride: 110 mEq/L (ref 96–112)
Creatinine, Ser: 0.99 mg/dL (ref 0.4–1.5)
GFR calc Af Amer: >60 mL/min (ref >60)
GFR calc Af Amer: >60 mL/min (ref >60)
GFR calc non Af Amer: >60 mL/min (ref >60)
Glucose, Bld: 100 mg/dL — ABNORMAL HIGH (ref 70–99)
Potassium: 3.3 mEq/L — ABNORMAL LOW (ref 3.5–5.1)
Potassium: 3.5 mEq/L (ref 3.5–5.1)
Potassium: 3.5 mEq/L (ref 3.5–5.1)
Sodium: 138 mEq/L (ref 135–145)
Sodium: 139 mEq/L (ref 135–145)
Sodium: 139 mEq/L (ref 135–145)
Total Bilirubin: 7.6 mg/dL — ABNORMAL HIGH (ref 0.3–1.2)
Total Bilirubin: 8.3 mg/dL — ABNORMAL HIGH (ref 0.3–1.2)
Total Protein: 4.8 g/dL — ABNORMAL LOW (ref 6.0–8.3)

## 2011-01-28 LAB — BASIC METABOLIC PANEL
BUN: 8 mg/dL (ref 6–23)
Chloride: 107 mEq/L (ref 96–112)
Creatinine, Ser: 0.94 mg/dL (ref 0.4–1.5)
GFR calc non Af Amer: >60 mL/min (ref >60)
Glucose, Bld: 74 mg/dL (ref 70–99)

## 2011-01-28 LAB — DIFFERENTIAL
Basophils Absolute: 0 10*3/uL (ref 0.0–0.1)
Basophils Absolute: 0 10*3/uL (ref 0.0–0.1)
Basophils Relative: 0 % (ref 0–1)
Basophils Relative: 0 % (ref 0–1)
Eosinophils Relative: 2 % (ref 0–5)
Eosinophils Relative: 1 % (ref 0–5)
Lymphocytes Relative: 5 % — ABNORMAL LOW (ref 12–46)
Monocytes Absolute: 0.8 10*3/uL (ref 0.1–1.0)
Monocytes Absolute: 0.6 10*3/uL (ref 0.1–1.0)
Monocytes Relative: 7 % (ref 3–12)
Neutro Abs: 7.3 10*3/uL (ref 1.7–7.7)

## 2011-01-28 LAB — BRAIN NATRIURETIC PEPTIDE: Pro B Natriuretic peptide (BNP): 535 pg/mL — ABNORMAL HIGH (ref 0.0–100.0)

## 2011-01-28 LAB — ABO/RH: ABO/RH(D): A POS

## 2011-01-28 LAB — CARBOXYHEMOGLOBIN
Total hemoglobin: 8.6 g/dL — ABNORMAL LOW (ref 13.5–18.0)
Total hemoglobin: 9 g/dL — ABNORMAL LOW (ref 13.5–18.0)

## 2011-01-28 LAB — APTT: aPTT: 45 seconds — ABNORMAL HIGH (ref 24–37)

## 2011-01-28 LAB — MAGNESIUM: Magnesium: 1.8 mg/dL (ref 1.5–2.5)

## 2011-01-28 LAB — PROCALCITONIN: Procalcitonin: 0.71 ng/mL

## 2011-01-29 LAB — PHOSPHORUS: Phosphorus: 4.1 mg/dL (ref 2.3–4.6)

## 2011-01-29 LAB — URINALYSIS, ROUTINE W REFLEX MICROSCOPIC
Glucose, UA: NEGATIVE mg/dL
Specific Gravity, Urine: 1.019 (ref 1.005–1.030)
pH: 5.5 (ref 5.0–8.0)

## 2011-01-29 LAB — POCT I-STAT 3, ART BLOOD GAS (G3+)
Acid-Base Excess: 1 mmol/L (ref 0.0–2.0)
Acid-base deficit: 15 mmol/L — ABNORMAL HIGH (ref 0.0–2.0)
Bicarbonate: 21.2 mEq/L (ref 20.0–24.0)
Bicarbonate: 23.2 mEq/L (ref 20.0–24.0)
Bicarbonate: 23.4 mEq/L (ref 20.0–24.0)
Bicarbonate: 24.1 mEq/L — ABNORMAL HIGH (ref 20.0–24.0)
O2 Saturation: 100 %
O2 Saturation: 100 %
O2 Saturation: 99 %
O2 Saturation: 99 %
Patient temperature: 34.6
Patient temperature: 36.2
Patient temperature: 96.8
Patient temperature: 97.4
Patient temperature: 98.6
TCO2: 21 mmol/L (ref 0–100)
TCO2: 24 mmol/L (ref 0–100)
TCO2: 25 mmol/L (ref 0–100)
TCO2: 25 mmol/L (ref 0–100)
pCO2 arterial: 21.1 mmHg — ABNORMAL LOW (ref 35.0–45.0)
pCO2 arterial: 22.2 mmHg — ABNORMAL LOW (ref 35.0–45.0)
pCO2 arterial: 35.1 mmHg (ref 35.0–45.0)
pCO2 arterial: 36.7 mmHg (ref 35.0–45.0)
pH, Arterial: 7.395 (ref 7.350–7.450)
pH, Arterial: 7.442 (ref 7.350–7.450)
pH, Arterial: 7.476 — ABNORMAL HIGH (ref 7.350–7.450)
pH, Arterial: 7.58 — ABNORMAL HIGH (ref 7.350–7.450)
pH, Arterial: 7.582 — ABNORMAL HIGH (ref 7.350–7.450)
pO2, Arterial: 120 mmHg — ABNORMAL HIGH (ref 80.0–100.0)
pO2, Arterial: 189 mmHg — ABNORMAL HIGH (ref 80.0–100.0)
pO2, Arterial: 93 mmHg (ref 80.0–100.0)

## 2011-01-29 LAB — APTT
aPTT: 105 seconds — ABNORMAL HIGH (ref 24–37)
aPTT: 38 seconds — ABNORMAL HIGH (ref 24–37)

## 2011-01-29 LAB — COMPREHENSIVE METABOLIC PANEL
ALT: 121 U/L — ABNORMAL HIGH (ref 0–53)
ALT: 132 U/L — ABNORMAL HIGH (ref 0–53)
ALT: 98 U/L — ABNORMAL HIGH (ref 0–53)
AST: 144 U/L — ABNORMAL HIGH (ref 0–37)
AST: 176 U/L — ABNORMAL HIGH (ref 0–37)
AST: 230 U/L — ABNORMAL HIGH (ref 0–37)
Albumin: 1.5 g/dL — ABNORMAL LOW (ref 3.5–5.2)
Albumin: 1.9 g/dL — ABNORMAL LOW (ref 3.5–5.2)
BUN: 36 mg/dL — ABNORMAL HIGH (ref 6–23)
CO2: 26 mEq/L (ref 19–32)
CO2: 9 mEq/L — CL (ref 19–32)
Calcium: 7.4 mg/dL — ABNORMAL LOW (ref 8.4–10.5)
Calcium: 7.4 mg/dL — ABNORMAL LOW (ref 8.4–10.5)
Calcium: 7.4 mg/dL — ABNORMAL LOW (ref 8.4–10.5)
Chloride: 107 mEq/L (ref 96–112)
Creatinine, Ser: 1.34 mg/dL (ref 0.4–1.5)
Creatinine, Ser: 1.5 mg/dL (ref 0.4–1.5)
Creatinine, Ser: 1.83 mg/dL — ABNORMAL HIGH (ref 0.4–1.5)
GFR calc Af Amer: 43 mL/min — ABNORMAL LOW (ref >60)
GFR calc Af Amer: 54 mL/min — ABNORMAL LOW (ref >60)
GFR calc Af Amer: >60 mL/min (ref >60)
GFR calc non Af Amer: 36 mL/min — ABNORMAL LOW (ref >60)
GFR calc non Af Amer: 45 mL/min — ABNORMAL LOW (ref >60)
Glucose, Bld: 120 mg/dL — ABNORMAL HIGH (ref 70–99)
Sodium: 141 mEq/L (ref 135–145)
Sodium: 142 mEq/L (ref 135–145)
Sodium: 143 mEq/L (ref 135–145)
Total Bilirubin: 1.6 mg/dL — ABNORMAL HIGH (ref 0.3–1.2)
Total Protein: 4.6 g/dL — ABNORMAL LOW (ref 6.0–8.3)
Total Protein: 4.7 g/dL — ABNORMAL LOW (ref 6.0–8.3)
Total Protein: 4.8 g/dL — ABNORMAL LOW (ref 6.0–8.3)

## 2011-01-29 LAB — PREPARE FRESH FROZEN PLASMA
Unit division: 0
Unit division: 0

## 2011-01-29 LAB — CARDIAC PANEL(CRET KIN+CKTOT+MB+TROPI)
CK, MB: 18.7 ng/mL (ref 0.3–4.0)
CK, MB: 62.8 ng/mL (ref 0.3–4.0)
CK, MB: 71.1 ng/mL (ref 0.3–4.0)
CK, MB: 8.1 ng/mL (ref 0.3–4.0)
Relative Index: 11.8 — ABNORMAL HIGH (ref 0.0–2.5)
Relative Index: 13 — ABNORMAL HIGH (ref 0.0–2.5)
Relative Index: 14.8 — ABNORMAL HIGH (ref 0.0–2.5)
Relative Index: 17.3 — ABNORMAL HIGH (ref 0.0–2.5)
Relative Index: INVALID (ref 0.0–2.5)
Total CK: 144 U/L (ref 7–232)
Total CK: 410 U/L — ABNORMAL HIGH (ref 7–232)
Troponin I: 3.25 ng/mL (ref 0.00–0.06)
Troponin I: 4.26 ng/mL (ref 0.00–0.06)
Troponin I: 5.66 ng/mL (ref 0.00–0.06)

## 2011-01-29 LAB — CBC
HCT: 14.7 % — ABNORMAL LOW (ref 39.0–52.0)
HCT: 21.3 % — ABNORMAL LOW (ref 39.0–52.0)
HCT: 27.5 % — ABNORMAL LOW (ref 39.0–52.0)
HCT: 28 % — ABNORMAL LOW (ref 39.0–52.0)
HCT: 28.8 % — ABNORMAL LOW (ref 39.0–52.0)
Hemoglobin: 4.4 g/dL — CL (ref 13.0–17.0)
Hemoglobin: 7.5 g/dL — ABNORMAL LOW (ref 13.0–17.0)
Hemoglobin: 8.3 g/dL — ABNORMAL LOW (ref 13.0–17.0)
Hemoglobin: 8.5 g/dL — ABNORMAL LOW (ref 13.0–17.0)
Hemoglobin: 9.1 g/dL — ABNORMAL LOW (ref 13.0–17.0)
MCH: 28.2 pg (ref 26.0–34.0)
MCH: 29.3 pg (ref 26.0–34.0)
MCH: 29.5 pg (ref 26.0–34.0)
MCHC: 33.5 g/dL (ref 30.0–36.0)
MCHC: 33.7 g/dL (ref 30.0–36.0)
MCHC: 34.9 g/dL (ref 30.0–36.0)
MCHC: 35.2 g/dL (ref 30.0–36.0)
MCV: 84.3 fL (ref 78.0–100.0)
MCV: 87.6 fL (ref 78.0–100.0)
MCV: 94.2 fL (ref 78.0–100.0)
Platelets: 196 10*3/uL (ref 150–400)
Platelets: 258 10*3/uL (ref 150–400)
Platelets: 271 10*3/uL (ref 150–400)
RBC: 1.56 MIL/uL — ABNORMAL LOW (ref 4.22–5.81)
RBC: 2.83 MIL/uL — ABNORMAL LOW (ref 4.22–5.81)
RBC: 3.08 MIL/uL — ABNORMAL LOW (ref 4.22–5.81)
RBC: 3.32 MIL/uL — ABNORMAL LOW (ref 4.22–5.81)
RDW: 15.3 % (ref 11.5–15.5)
RDW: 15.8 % — ABNORMAL HIGH (ref 11.5–15.5)
RDW: 17.5 % — ABNORMAL HIGH (ref 11.5–15.5)
WBC: 14.2 10*3/uL — ABNORMAL HIGH (ref 4.0–10.5)
WBC: 18.5 10*3/uL — ABNORMAL HIGH (ref 4.0–10.5)
WBC: 20.3 10*3/uL — ABNORMAL HIGH (ref 4.0–10.5)

## 2011-01-29 LAB — BASIC METABOLIC PANEL
Calcium: 7.3 mg/dL — ABNORMAL LOW (ref 8.4–10.5)
Chloride: 110 mEq/L (ref 96–112)
GFR calc Af Amer: 55 mL/min — ABNORMAL LOW (ref >60)
GFR calc non Af Amer: 43 mL/min — ABNORMAL LOW (ref >60)
GFR calc non Af Amer: 46 mL/min — ABNORMAL LOW (ref >60)
Glucose, Bld: 81 mg/dL (ref 70–99)
Potassium: 3.6 mEq/L (ref 3.5–5.1)
Potassium: 3.6 mEq/L (ref 3.5–5.1)
Sodium: 140 mEq/L (ref 135–145)
Sodium: 143 mEq/L (ref 135–145)

## 2011-01-29 LAB — GLUCOSE, CAPILLARY
Glucose-Capillary: 105 mg/dL — ABNORMAL HIGH (ref 70–99)
Glucose-Capillary: 108 mg/dL — ABNORMAL HIGH (ref 70–99)
Glucose-Capillary: 108 mg/dL — ABNORMAL HIGH (ref 70–99)
Glucose-Capillary: 110 mg/dL — ABNORMAL HIGH (ref 70–99)
Glucose-Capillary: 112 mg/dL — ABNORMAL HIGH (ref 70–99)
Glucose-Capillary: 122 mg/dL — ABNORMAL HIGH (ref 70–99)
Glucose-Capillary: 126 mg/dL — ABNORMAL HIGH (ref 70–99)
Glucose-Capillary: 134 mg/dL — ABNORMAL HIGH (ref 70–99)
Glucose-Capillary: 134 mg/dL — ABNORMAL HIGH (ref 70–99)
Glucose-Capillary: 137 mg/dL — ABNORMAL HIGH (ref 70–99)
Glucose-Capillary: 79 mg/dL (ref 70–99)
Glucose-Capillary: 81 mg/dL (ref 70–99)
Glucose-Capillary: 89 mg/dL (ref 70–99)

## 2011-01-29 LAB — BRAIN NATRIURETIC PEPTIDE: Pro B Natriuretic peptide (BNP): 735 pg/mL — ABNORMAL HIGH (ref 0.0–100.0)

## 2011-01-29 LAB — CULTURE, BLOOD (ROUTINE X 2)
Culture  Setup Time: 201110290329
Culture: NO GROWTH

## 2011-01-29 LAB — URINE MICROSCOPIC-ADD ON

## 2011-01-29 LAB — LACTIC ACID, PLASMA
Lactic Acid, Venous: 1 mmol/L (ref 0.5–2.2)
Lactic Acid, Venous: 5.7 mmol/L — ABNORMAL HIGH (ref 0.5–2.2)

## 2011-01-29 LAB — URINE CULTURE
Culture  Setup Time: 201110291201
Culture: NO GROWTH

## 2011-01-29 LAB — TROPONIN I: Troponin I: 1.46 ng/mL (ref 0.00–0.06)

## 2011-01-29 LAB — HEPATIC FUNCTION PANEL
ALT: 160 U/L — ABNORMAL HIGH (ref 0–53)
AST: 265 U/L — ABNORMAL HIGH (ref 0–37)
AST: 308 U/L — ABNORMAL HIGH (ref 0–37)
Albumin: 2.1 g/dL — ABNORMAL LOW (ref 3.5–5.2)
Alkaline Phosphatase: 1027 U/L — ABNORMAL HIGH (ref 39–117)
Bilirubin, Direct: 1.3 mg/dL — ABNORMAL HIGH (ref 0.0–0.3)
Indirect Bilirubin: 1 mg/dL — ABNORMAL HIGH (ref 0.3–0.9)
Total Bilirubin: 3.4 mg/dL — ABNORMAL HIGH (ref 0.3–1.2)
Total Protein: 4.6 g/dL — ABNORMAL LOW (ref 6.0–8.3)

## 2011-01-29 LAB — AMYLASE: Amylase: 41 U/L (ref 0–105)

## 2011-01-29 LAB — TYPE AND SCREEN
Antibody Screen: NEGATIVE
Unit division: 0

## 2011-01-29 LAB — MAGNESIUM: Magnesium: 1.9 mg/dL (ref 1.5–2.5)

## 2011-01-29 LAB — CULTURE, RESPIRATORY W GRAM STAIN

## 2011-01-29 LAB — LIPASE, BLOOD: Lipase: 21 U/L (ref 11–59)

## 2011-01-29 LAB — CK TOTAL AND CKMB (NOT AT ARMC)
CK, MB: 48.5 ng/mL (ref 0.3–4.0)
Relative Index: 15.7 — ABNORMAL HIGH (ref 0.0–2.5)

## 2011-01-29 LAB — HEMOGLOBIN AND HEMATOCRIT, BLOOD: HCT: 20.3 % — ABNORMAL LOW (ref 39.0–52.0)

## 2011-01-29 LAB — PROTIME-INR
INR: 2.59 — ABNORMAL HIGH (ref 0.00–1.49)
Prothrombin Time: 27.9 seconds — ABNORMAL HIGH (ref 11.6–15.2)

## 2011-04-04 NOTE — Consult Note (Signed)
NAME:  Todd Davies, Todd Davies NO.:  [id number]   MEDICAL RECORD NO.:  [id number]                   PATIENT TYPE:  INP   LOCATION:  4708                                 FACILITY:  [facility]   PHYSICIAN:  Peter M. Swaziland, M.D.               DATE OF BIRTH:  [birth year]   DATE OF CONSULTATION:  06/29/2002  DATE OF DISCHARGE:                              CARDIOLOGY CONSULTATION   REASON FOR CONSULTATION:  The patient is a 77 year old white male with a  known history of atherosclerotic coronary artery disease.  He had initial  coronary artery bypass surgery in 1978 with subsequent redo surgery in  October of 2001.  He has a history of paroxysmal atrial fibrillation,  although no documented atrial fibrillation since November of 2001.  He has a  history of major depression.  The patient felt well until this past Sunday  evening.  He had been in the bathroom, although he does not remember for  what reason.  He came out of the bathroom and passed out with complete loss  of consciousness.  He states he awoke at the foot of his bed.  He does not  know how long he was out and no one was with him at the time.  After  awakening, he complained of headache and marked weakness on arising.  He  continued to feel ill throughout the day Monday with episodic spells of  lightheadedness and weakness and some symptoms of vertigo.  He did not black  out again.  He has had no chest pain but does complain of nausea.   PAST MEDICAL HISTORY:  His past medical history is significant for:  1. Coronary artery disease, status post redo CABG in October of 2001.  At     that time, he had LIMA graft to the LAD, saphenous vein graft     sequentially to the first and second obtuse marginal branches and a     saphenous vein graft to the right coronary artery.  Left ventricular     function was normal at that time.  2. Chronic depression.  3. Chronic hearing loss.  4. Paroxysmal atrial fibrillation,  on chronic Coumadin.  5. History of severe rectal dysfunction, status post multiple surgeries.  6. Chronic constipation.  7. BPH.  8. Dyslipidemia.   MEDICATIONS:  1. Coumadin 2.5 mg five days a week, 5 mg two days a week.  2. Valium 5 mg one to three tablets daily.  3. Atenolol 25 mg per day.  4. Pravachol 20 mg per day.  5. Zyprexa 2.5 mg daily.  6. Lanoxin 0.125 mg daily.   ALLERGIES:  Allergies to SULFA and COMPAZINE.   SOCIAL HISTORY:  He is widowed.  He denies smoking or alcohol use.   FAMILY HISTORY:  Father died at age 14 with myocardial infarction.  Mother  died at age 51 with myocardial infarction.  PHYSICAL EXAMINATION:  GENERAL:  The patient is a pleasant white male in no  apparent distress.  VITAL SIGNS:  Blood pressure is 110/70.  Pulse is 60 and normal sinus  rhythm.  His blood pressure on admission was recorded as 86/20.  HEENT:  Unremarkable.  NECK:  He had no JVD or bruits.  LUNGS:  Clear.  CARDIAC:  Without gallops, murmurs, rubs or clicks.  ABDOMEN:  Abdomen is soft and nontender without masses.  EXTREMITIES:  Extremities are without edema or cyanosis.  Pulses are 2+ and  symmetric.  NEUROLOGIC:  Exam is intact.   LABORATORY AND ACCESSORY CLINICAL DATA:  ECG shows normal sinus rhythm with  a left anterior fascicular block and incomplete right bundle branch block.  Since his prior tracing in November of 2001, marked T wave changes have  resolved.   White count is 5900, hemoglobin 13.5, hematocrit 40.5, platelets 155,000.  Sodium 133, potassium 4.1, chloride 100, CO2 27, BUN 6, creatinine 0.8,  glucose 151.  Digoxin level is 0.2.  CK was 143 with MB of 5.6.  Tropon is  not obtained.   Chest x-ray is pending.   IMPRESSION:  1. Syncope probably related to hypotension, based on his initial recordings.     Question if exacerbated by orthostatic changes.  Also need to rule out     primary arrhythmia.  The patient has chronic sinus bradycardia related  to     his medications with baseline heart rate of 55.  Need to rule out central     nervous system event or seizure.  I doubt myocardial infarction, but     enzymatic data is incomplete at this time.  2. Status post redo coronary artery bypass graft.  3. Paroxysmal atrial fibrillation.  4. Depression.  5. Constipation.  6. Dyslipidemia.   PLAN:  I agree with holding his atenolol due to his low blood pressure and  low heart rate.  Will obtain serial cardiac enzymes.  Will obtain a pro time  on Coumadin.  Recommend an echocardiogram, carotid Dopplers studies and an  unenhanced CT of the head to rule out CVA or subarachnoid hemorrhage.  Also  may want to consider an EEG.  While Zyprexa has been associated with  orthostatic hypotension, the patient has been on this drug chronically  without recent change.                                               Peter M. Swaziland, M.D.    PMJ/MEDQ  D:  06/29/2002  T:  07/02/2002  Job:  [id number]   cc:   Sibyl Parr. Darrick Penna, M.D.

## 2011-04-04 NOTE — Op Note (Signed)
[place]  Patient:    Todd Davies, Todd Davies                       MRN: [id number] Proc. Date: 09/10/00 Adm. Date:  [id number] Attending:  Charlett Lango CC:         Peter M. Swaziland, M.D.  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  OPERATION PERFORMED:  Redo median sternotomy, extracorporeal circulation, redo coronary artery bypass grafting x 4 (left internal mammary artery to left anterior descending, saphenous vein graft to distal right coronary artery, sequential saphenous vein graft to obtuse marginal 1 and obtuse marginal 2).  SURGEON:  Salvatore Decent. Dorris Fetch, M.D.  ASSISTANT:  Sherrie George, P.A.  ANESTHESIA:  General.  FINDINGS:  Dense interpericardial adhesions, mammary artery and saphenous vein of good quality, distal right coronary small, remaining targets good.  INDICATIONS FOR PROCEDURE:  [name] is a 77 year old gentleman with a prior history of coronary artery bypass grafting x 2 by Dr. Micah Noel.  He did well until 1993 when he had a myocardial infarction secondary to occlusion of his right coronary graft.  He then was treated medically from that point and had done well until this admission when he was admitted with unstable angina and ruled in for a myocardial infarction.  Cardiac catheterization revealed total occlusion of the saphenous vein graft to the right coronary artery with 90% ulcerated plaque in the lesion in the saphenous vein graft to the left anterior descending and severe native three vessel disease.  The patient was referred for redo coronary artery bypass grafting.  The indications, risks, benefits and alternative procedures were discussed in detail with the  patient and his family.  He understood and accepted the risks and agreed to proceed.  DESCRIPTION OF PROCEDURE:   The patient was brought to the preop holding area on September 10, 2000.  Lines were placed to monitor arterial, central venous and pulmonary arterial pressure.  EKG leads were placed for continuous telemetry.  The patient was taken to the operating room, anesthetized and intubated.  A Foley catheter was placed.  Intravenous antibiotics were administered.  The chest, abdomen and legs were prepped and draped in the usual fashion.  An incision was made through the patients prior median sternotomy scar.  It was carried through the skin and subcutaneous tissue and carried down to the sternum.  The nonabsorbable sutures were removed.  The sternal wires were removed without difficulty.  The sternum was divided with an oscillating saw. The anterior table was divided along the length of the sternum initially, then the posterior table was divided.  There was no injury to underlying structures.  Gently elevating the edge of the sternum with bone hooks, the mediastinal tissues were dissected off the lower edge of the sternum on both sides to allow for placement of retractors.  Both pleural spaces were entered. The left internal mammary artery then was harvested in standard fashion. Large branches were doubly clipped and divided with scissors.  Small branches were clipped on the mammary side, divided with cautery on the chest wall side. The mammary was a good quality conduit.  Simultaneously an incision was made in the medial aspect of the right leg and the greater saphenous vein was harvested from the ankle to the knee.  Saphenous vein was also a good quality conduit.  The patient was fully heparinized prior to dividing the distal end of the mammary artery.  There was good flow through the cut end of the vessel. The mammary was placed in a papaverine soaked sponge and placed into the  left pleural space.  An Ankeney retractor then was placed and the mediastinal dissection was initiated along the right edge of the heart.  The pericardium had not been reapproximated.  There were dense adhesions between the pleural, the mediastinal flap and the epicardial surface of the heart as well as between the pericardium and the epicardial surface of the heart.  There were very dense adhesions in the region of the right atrium and multisecting out the right atrium.  An atriotomy was made.  This was controlled with a 2-0 Ethibond pursestring suture and later used for the venous cannulation site. The dissection then was carried out on the ascending aorta.  Great care was taken not to manipulate or traumatize the vein grafts.  The mediastinal fat was dissected off the ascending aorta.  The pericardium was densely adherent and was essentially left on the aorta.  The innominate vein was identified and preserved.  At this point the ACT was adequate.  The aorta was cannulated via concentric 2-0 Ethibond pledgeted pursestring sutures.  A dual stage venous cannula was placed via the pursestring suture in the pre-existing atriotomy. Cardiopulmonary bypass was instituded and the patient was cooled to 32 degrees Celsius.  The remainder of the take-down of the intrapericardial adhesions was done on bypass.  Again care was taken not to manipulate or disturb the existing vein grafts.  The coronary artery targets were identified.  The conduits were inspected and cut to length.  A foam pad was placed in the pericardium to protect the left phrenic nerve.  A temperature probe was placed in the myocardial septum.  A retrograde cardioplegia cannula was placed via pursestring suture in the right atrium and directed into the coronary sinus. Positioning was confirmed with palpation of the tip as well as a coronary sinus wedge tracing with balloon inflation.  An antegrade cardioplegia cannula was placed in  the ascending aorta proximal to the take off of the old vein  grafts.  There was a tear in the atrium at the site of the venous cannulation and a second Ethibond pursestring suture was placed.  This diminished the amount of air in the venous circuit.  The aorta was crossclamped, the left ventricle was emptied via the aortic root vent.  Cardiac arrest then was achieved with a combination of cold retrograde and antegrade blood cardioplegia and topical iced saline.  Initially, 800 cc of cardioplegia was administered retrograde.  There was good flow.  There was good pressure increase and the balloon of the catheter could be palpated in the coronary sinus.  However, the patient failed to cool adequately with retrograde cardioplegia.  The aortic crossclamp was inspected to ensure that it was completely across the aorta and tightened a notch to ensure that there was complete approximation of the aortic walls.  The remainder of the cardioplegia was administered antegrade.  The patient had rapid cooling with the antegrade cardioplegia.  After achieving complete diastolic arrest and myocardial steptal temperature of 10 degrees Celsius, the following distal anastomoses were performed.  The first saphenous vein graft was placed end-to-side to the distal right coronary artery.  This was a very small right coronary, but did give off a dominant PDA branch.  It was only 1.5 mm in diameter and was located in the atrioventricular groove, deep within the groove and was initially very difficult to locate.  The anastomosis was performed in an end-to-side fashion using a running 7-0 Prolene suture and the vein graft was of good quality. There was good flow through the graft at completion of the anastomosis. Cardioplegia was administered.  Additional cardioplegia was also administered down the aortic root.  Next, the heart was retracted to the right side exposing the obtuse marginal branch of the left  circumflex coronary artery and a vein graft was placed sequentially to the first and second obtuse marginal branches.  The first obtuse marginal was a 1.3 mm good quality target and the second marginal was a 1.8 mm good quality target.  A side-to-side anastomosis was performed to OM1 off a side branch of the vein graft and an end-to-side anastomosis was performed to OM2.  Both anastomoses were performed with running 7-0 Prolene sutures.  AT the completion of the final anastomosis the patient was given additional cardioplegia through the vein grafts.  There was good hemostasis of both anastomoses.  Next, the pericardium was incised to allow passage of the left mammary artery. The distal end of the mammary was spatulated.  It was anastomosed end-to-side to the distal LAD.  The LAD was a 1.8 mm good quality target.  The mammary was of good quality.  The anastomosis was performed with a running 8-0 Prolene suture.  At the completion of the mammary to LAD anastomosis the bulldog clamp was briefly removed to inspect for hemostasis.  A single 8-0 Prolene patch suture was required at the heel of the anastomosis.  The mammary pedicle was tacked to the epicardial surface of the heart with 6-0 Prolene sutures.  The bulldog clamp was replaced on the mammary artery.  Additional cardioplegia was administered.  The cardioplegia cannula was removed from the proximal ascending aorta.  The vein grafts were cut to length and the proximal vein graft anastomoses were performed to 4.0 mm punch aortotomies with running 6-0 Prolene sutures.  At completion of the final proximal anastomosis, the bulldog clamp was removed from the mammary artery.  Septal rewarming was sluggish. The mammary graft was inspected.  The Prolene stay sutures were removed.  The anastomosis appeared intact.  However, there continued to be very sluggish septal rewarming.  Therefore, the bulldog clamp was replaced on the mammary artery.  A  new cardioplegia cannula was placed in the ascending aorta.  The aorta had remained crossclamped and cardiac arrest was achieved once again with cold antegrade blood cardioplegia and topical iced saline.  The mammary to LAD anastomosis was taken down.  There was no obvious technical problem with the anastomosis.  The mammary was cut slightly more proximally and a second anastomosis was performed again with a running 8-0 Prolene suture.  The anastomosis was probed proximally and distally and into the mammary artery as well with a 1.5 mm probe.  It was widely patent in all directions.  The bulldog clamp was removed.  Immediate and rapid septal rewarming occurred. The mammary pedicle was tacked to the epicardial surface of the heart with 6-0 Prolene sutures.  The aortic crossclamp was removed.  Total crossclamp time was 103 minutes.  The patient fibrillated and required a total of three defibrillations of 20 joules before being DDD paced at 80 beats per minute. All proximal and distal anastomoses were inspected for hemostasis.  Epicardial pacing wires were placed on the  right ventricle and right atrium and the patient was rewarmed to 37 degrees Celsius.  When the patient reached 37 degrees Celsius, he was weaned from cardiopulmonary bypass.  He was on a dopamine drip and was DDD paced at time of separation from bypass.  Total bypass time was 168 minutes.  A test dose of protamine was administered and was well tolerated.  The atrial cannula had continued leakage around it despite the two pursestrings.  A third pursestring suture with a 4-0 Prolene suture with multiple pledgets was placed.  The venous cannula was removed tying the Prolene pursestring to control the bleeding and there was good hemostasis at the cannulation site. The aortic cannula was removed.  There was good hemostasis at the aortic cannulation site, cardioplegia cannula had been removed.  Both the retrograde and antegrade  cardioplegia cannulas were removed prior to coming off bypass. The remainder of the protamine was administered without incident.  The chest was irrigated with 1L of normal saline containing 1 gm of vancomycin. Hemostasis was achieved.  A left pleural and two mediastinal chest tubes were placed through separate subcostal incisions.  Care was taken to remain above  the posterior rectus sheath with these chest tubes.  There was no way to reapproximate the pericardium.  The sternum was closed with heavy gauge interrupted simple and figure-of-eight stainless steel wires.  The pectoralis fascia was closed with running #1 Vicryl suture.  The subcutaneous tissue was closed with a running 2-0 Vicryl suture.  The skin was closed with 3-0 Vicryl subcuticular suture in both the chest and the leg.  The patient was hemodynamically stable throughout the postbypass period and was taken from the operating room to the surgical intensive care unit intubated and in stable condition. DD:  09/10/00 TD:  09/11/00 Job: [id number]

## 2011-04-04 NOTE — Discharge Summary (Signed)
NAME:  Todd Davies, KALB NO.:  [id number]   MEDICAL RECORD NO.:  [id number]                   PATIENT TYPE:  INP   LOCATION:  4708                                 FACILITY:  [facility]   PHYSICIAN:  Reita Chard                         DATE OF BIRTH:  13-Jul-1925   DATE OF ADMISSION:  06/28/2002  DATE OF DISCHARGE:                                 DISCHARGE SUMMARY   PROCEDURES:  1. Echocardiogram.  2. CT of head.  3. Carotid Dopplers.   DISCHARGE DIAGNOSES:  1. Syncopal episode.  2. Recurrent paroxysmal atrial fibrillation.  3. Coronary artery disease.  4. Depression.  5. Superficial skin ulceration of lower extremity.  6. History of old myocardial infarction.  7. Hemorrhoids.  8. Allergic rhinitis.  9. Benign prostatic hypertrophy.   DISCHARGE MEDICATIONS:  1. Valium 5 mg one p.o. b.i.d. p.r.n. anxiety.  2. Pravachol 10 mg one p.o. q.h.s.  3. Coumadin 5 mg one tablet p.o. q.d. on Monday and Friday.  4. Coumadin 2.5 mg one tablet p.o. q.d. on Tuesday, Wednesday, Thursday,     Saturday, and Sunday.  5. Zyprexa 2.5 mg one tablet p.o. q.d.  6. Atenolol 25 mg one p.o. b.i.d.  7. Bactroban 2% cream, apply small amount to infected area on lower     extremity b.i.d. times one week.   HOSPITAL COURSE:  The patient is a 77 year old, Caucasian male with a past  medical history significant for coronary artery disease status-post CABG  times 2, history of myocardial infarction, and depression presenting  secondary to a syncopal event.  The patient reports that he felt dizzy and  weak immediately following syncopal event, however, admits that he had no  warnings prior to event.  The patient was admitted for syncopal workup.  CT  of head was ruled out acute abnormality.  Carotid Dopplers are stable rule  out acute abnormality.  Cardiac enzymes were negative throughout  hospitalization.  The patient was noted to be bradycardic during  hospitalization with a  heart rate ranging from 54 to 60 beats per minute.  Also of note, TSH was within normal limits.  During hospitalization, the  patient converted into recurrent paroxysmal atrial fibrillation.  The  patient converted back into normal sinus rhythm after initiation of beta  blocker, as well as Cardizem load.  The patient remained in sinus rhythm  throughout hospitalization.  Cardiology consult was obtained and Dr. Swaziland,  patient's cardiologist evaluated patient during hospitalization.  Cardiology  had no other suggestions of etiology of syncopal episode other than  potential bradycardia and hypotension.  The patient was discharged on  hospital day number 6 in stable condition.   HOSPITAL PROBLEMS:  1. Syncopal episode.  Workup within normal limits in-hospitalization.  CT of     head normal.  Cardiac enzymes negative times 4 sets.  TSH within normal     limits.  No evidence of infection or dehydration.  Carotid Dopplers were     stable.  Patient was placed on telemetry and noted to be bradycardic at     approximately a rate of 58 throughout hospitalization.  Electrolytes were     within normal limits.  No further syncopal episodes occurred throughout     hospitalization.  Bradycardia was thought to be potential cause of     syncopal episode upon admission, but beta blocker was held, however, then     patient converted to atrial fibrillation and patient was restarted on     Atenolol without further syncopal episodes.  Also of note, the patient's     Digoxin was held throughout hospitalization and was not restarted upon     discharge.  2. Atrial fibrillation recurrent.  The patient had recurrence of atrial     fibrillation once his Digoxin, as well as Atenolol therapies were held.     The patient was restarted on beta blocker therapy, as well as Coumadin     therapy during hospitalization.  The patient will be discharged on     Atenolol 25 mg b.i.d. and Coumadin therapy as previously  prescribed prior     to hospitalization.  As mentioned above, cardiac enzymes were negative,     as well as TSH within normal limits.  Recurrence of atrial fibrillation     most likely secondary to withholding beta blockers, as well as Digoxin     therapies.  3. Coronary artery disease.  The patient is status-post CABG times 2, as     well as status-post acute MI.  Echocardiogram was performed during     hospitalization and patient was noted to have a normal ejection fraction     of 55 to 65%.  The patient will be discharged on Coumadin therapy at     discharge.  4. Depression.  The patient presenting on Zyprexa therapy.  Physician team     felt that Zyprexa could possibly be contributing to syncopal episode.  We     will order for manipulation of medications to patient's psychiatrist,     however, physician team may consider changing Zyprexa to other     medications if syncopal episodes do occur.  Diane Jari Pigg was used if need     for anxiety during hospitalization.  5. Superficial skin ulceration on lower extremity was dressed daily during     hospitalization and Bacitracin therapy was administered b.i.d.   DISCHARGE LABS:  Sodium 136, potassium 3.9, chloride 103, CO2 29, BUN 10,  creatinine 0.7, glucose 97, PT 25.5, INR 2.8.  PET CT:  No intracranial  lesions.  Mild cerebral atrophy.  Carotid Dopplers revealed the following:  No significant ICA stenosis bilaterally.  Antegrade vertebral artery flow  bilaterally.  Minimal diffuse plaque seen throughout vessels bilaterally.  No change since previous study in October of 2001.  CK's of 143, then 123,  then 171, CK-MB of 5.6, then 4.6, then 4.9, relative index of 8.9, then 3.7,  then 2.9.  TSH was 2.354.  Urinalysis within normal limits.  SGOT 27, SGPT  20, alkaline phosphatase 55, total bilirubin 0.5, total protein 5.9, albumin 3.1, calcium 8.4, white blood cells 5.9, hemoglobin 13.5, hematocrit 40.5,  and platelets 155,000.   Echocardiogram performed during hospitalization  revealed the following:  Overall left ventricular systolic function was  normal with an ejection fraction of 55 to 65%.  No  left ventricular regional  wall motion abnormalities.  Mild mitral valvular regurg.  Mild to moderately  increased _________ systolic pressure.  Moderate tricuspid valvular  regurgitation.   DISCHARGE RECOMMENDATIONS:  Activity:  No restrictions.  Diet:  Recommend  low salt, low fat, low cholesterol diet.  Pain management:  Not applicable.   SPECIAL INSTRUCTIONS:  The patient is to call back for recurrent syncopal  episodes or any chest pain or any other concerns.    FOLLOW UP:  The patient has a follow up appointment with Dr. Jonah Blue  at [facility] on Tuesday 07/19/2002 at 2:35 p.m.  Dr.  Swaziland of cardiology.  The patient is to call for an appointment in one  month.  Dr. Larena Sox of psychiatry.                                               Reita Chard    CS/MEDQ  D:  07/04/2002  T:  07/06/2002  Job:  [id number]   cc:   Jonah Blue, M.D.   Peter M. Swaziland, M.D.  [address]  [place], [place] [address]  Fax: [phone]   Benito Mccreedy

## 2011-04-04 NOTE — Op Note (Signed)
NAME:  Todd Davies, Todd Davies                          ACCOUNT NO.:  [id number]   MEDICAL RECORD NO.:  [id number]                   PATIENT TYPE:  AMB   LOCATION:  ENDO                                 FACILITY:  [facility]   PHYSICIAN:  Petra Kuba, M.D.                 DATE OF BIRTH:  1925-07-01   DATE OF PROCEDURE:  09/19/2002  DATE OF DISCHARGE:                                 OPERATIVE REPORT   PROCEDURE:  Colonoscopy with biopsy.   INDICATIONS:  Guaiac positivity, due for colonic screening.  Consent was  signed after risks, benefits, methods, and options thoroughly discussed in  the office.   MEDICATIONS:  Demerol 50 mg, Versed 4 mg.   DESCRIPTION OF PROCEDURE:  Rectal inspection is pertinent for his previous  surgical changes.  Digital exam is pertinent for a patulous rectum without  mass.  The pediatric video adjustable colonoscope was inserted and easily  advanced around the colon to the cecum.  This did require some abdominal  pressure but no position changes.  The cecum was identified by the  appendiceal orifice and the ileocecal valve.  No obvious abnormality was  seen on insertion.  The prep was fairly adequate.  There was some stool  adherent to the wall of the colon, particularly on the right, which cannot  be washed off.  The scope was slowly withdrawn.  On slow withdrawal through  the colon, other than one tiny mid-transverse polyp which was cold biopsied  x2, no other abnormalities were seen as we slowly withdrew back to the  rectum.  He had difficulty holding air based on his previous surgery so we  could not retroflex back in the rectum, but no other lesions were seen.  Anorectal pull-through was done, which confirmed the previous surgical  changes but no other abnormalities.  The scope was reinserted a short way up  the left side of the colon, air and water were suctioned, the scope removed.  The patient tolerated the procedure well.  There was no obvious immediate  complication.   ENDOSCOPIC DIAGNOSES:  1. Patulous rectum, unable to hold air or retroflex.  2. Tiny mid-transverse polyp, cold biopsied.  3. Otherwise within normal limits to the cecum.    PLAN:  Await pathology to determine future colonic screening, but probably  recheck in five years if doing well medically.  Happy to see back p.r.n.,  otherwise return care to Dr. Darrick Penna to recheck guaiacs, CBC, symptoms, and  see if a one-time upper GI, small bowel follow-through, or upper endoscopy  is needed.  In the meantime will okay him to restart his Coumadin today.  Petra Kuba, M.D.    MEM/MEDQ  D:  09/19/2002  T:  09/19/2002  Job:  [id number]   cc:   Peter M. Swazil[place], M.D.  [address]  [place], [place] [address]  Fax: [phone]   Sibyl Parr. Fields, M.D.  [address] [place]  [place] [address]  Fax: [phone]

## 2011-04-04 NOTE — Discharge Summary (Signed)
[facility]  Patient:    Todd Davies, Todd Davies                       MRN: [id number] Adm. Date:  [id number] Disc. Date: 09/19/00 Attending:  Charlett Davies Dictator:   Maxwell Marion, RNFA CC:         Todd Davies, M.D.  Todd Davies, M.D.  Family Practice Clinic, [facility]   Discharge Summary  DATE OF BIRTH:  1925-05-18  DATE OF SURGERY:  September 10, 2000  ADMISSION DIAGNOSIS:  Non-Q-wave myocardial infarction.  PAST MEDICAL HISTORY: 1. Coronary artery disease, status post CABG in 1978 by Dr. Nedra Davies. 2. Chronic depression, treated by Dr. Carlye Davies. 3. Hearing loss. 4. Chronic constipation, status post multiple abdominal surgeries. 5. BPH. 6. Hypertension. 7. Atrial fibrillation, being treated since March 2001.  ALLERGIES:  SULFA and COMPAZINE.  DISCHARGE DIAGNOSES: 1. Severe native and graft coronary artery disease, status post redo    coronary artery bypass grafting. 2. Postoperative pericarditis. 3. Perioperative myocardial infarction. 4. Postoperative bradycardia. 5. Cellulitis right thigh.  HOSPITAL COURSE:  [name] is a 77 year old man who was admitted on September 01, 2000, to the [facility] Emergency Room and Todd Davies with radiating substernal chest pain which was not relieved with sublingual nitroglycerin.  IV nitroglycerin was started in the emergency room and relieved his chest pain.  Cardiac catheterization was planned by Todd Davies after his Coumadin had been discontinued.  On September 04, 2000, cardiac catheterization revealed severe three vessel coronary artery disease, with an occluded saphenous vein graft to the right coronary artery, a severe stenosis of his saphenous vein graft to the LAD, and he was noted to have normal LV function.  Cardiac surgery consult was obtained with Dr. Charlett Davies, who recommended redo coronary artery bypass grafting as the preferred treatment choice for  this patient.  On September 04, 2000, preoperative Doppler studies revealed no significant carotid artery disease, and he was noted to have bilateral palpable pedal pulses.  On September 10, 2000, [name] underwent uneventful redo coronary artery bypass grafting x 4 with Dr. Charlett Davies.  Grafts placed at the time of procedure:  Left internal mammary artery to the LAD, saphenous vein was grafted in a sequential fashion to the OM 1 and OM 2, saphenous vein was grafted to the right coronary artery.  At the conclusion of the procedure he was transferred in stable condition to the SICU.  [name] postoperative has been marked by mild perioperative myocardial infarction, postoperative pericarditis, postoperative bradycardia, and atrial fibrillation.  He has remained hemodynamically stable and neurologically intact throughout his postoperative course.  On September 15, 2000, a 2-D echocardiogram was obtained which revealed mild LV dysfunction (ejection fraction 40-50%), mild MR, moderate to severe TR, PA pressure was estimated at 40-45 mmHg, mild LA, RA, and right ventricular enlargement. Todd Davies has made slow but steady progress.  His pericarditis is asymptomatic.  He has returned to normal sinus rhythm (he is on Coumadin and digoxin).  He has developed some mild cellulitis in his right thigh incision which is being treated with Keflex.  He has had some postoperative nausea, and it is resolving.  Dr. Dorris Davies anticipates him able to be discharged tomorrow, September 19, 2000, if his nausea remains resolved and his p.o. intake has improved.  DISCHARGE MEDICATIONS: 1. Ultram 50 mg 1-2 p.o. q.4-6h. p.r.n. for pain. 2. Atenolol 25 mg q.12h.  3. Lanoxin 0.125 mg q.d. 4. Protonix 40 mg q.d. 5. Keflex 500 mg t.i.d. 6. He is instructed to resume his home medications of:    a. Zyprexa 5 mg q.h.s.    b. Lithium 300 mg b.i.d.    c. Coumadin 5 mg q.d.    d. Antivert 25 mg q.d.    e. Valium 5  mg t.i.d.  FOLLOW-UP:  [name] is instructed to have his blood drawn at Dr. Illa Davies office for a follow-up of his PT/INR on Tuesday, September 22, 2000.  He can go there any time between 8 and 4.  He has an appointment to see Todd Davies on October 02, 2000.  He will have a chest x-ray taken at that time.  He also has an appointment on October 07, 2000, to see Dr. Dorris Davies.DD:  09/18/00 TD:  09/19/00 Job: [id number]

## 2011-04-04 NOTE — Cardiovascular Report (Signed)
NAMEMarland Kitchen  Todd, Davies NO.:  [id number]   MEDICAL RECORD NO.:  [id number]                   PATIENT TYPE:  INP   LOCATION:  2018                                 FACILITY:  [facility]   PHYSICIAN:  Elmore Guise., M.D.           DATE OF BIRTH:  10/05/1925   DATE OF PROCEDURE:  06/07/2004  DATE OF DISCHARGE:                              CARDIAC CATHETERIZATION   PREOPERATIVE DIAGNOSES:  1. Syncope.  2. Sick sinus syndrome.   POSTOPERATIVE DIAGNOSES:  1. Syncope.  2. Sick sinus syndrome.   PROCEDURE:  Pacemaker implant.   SURGEON:  Rosine Abe, M.D.   PROCTORING CARDIOLOGIST:  Colleen Can. Deborah Chalk, M.D.   DESCRIPTION OF PROCEDURE:  The patient was brought to the cardiac  catheterization lab.  After informed consent was obtained, he was prepped  and draped in sterile fashion.  A small incision was made in the right upper  chest.  The subcutaneous pocket was then made.  Hemostasis was obtained.  Vascular access was obtained with 2 separate subclavian sticks.  A 7 French  peel-away introducer was placed without difficulty.  A ventricular  Medtronics 5076-52 cm access sensation lead was used, serial #[id number].  The following thresholds were recorded: Ventricular 0.4 volts to capture at  0.4 milliamps current with a 0.5 millisecond pulse width.  Impedance was  1013 ohms and R waves measured 10.0 millivolts.  The atrial lead was placed  using an atrial Medtronic 4076-45 cm active fixation lead, serial #[id number]  [id number].  The atrial numbers are as follows:  0.6 volts to capture at 1.5  milliamps current with a 0.5 millisecond pulse width.  Impedance was 486  ohms and P waves measured 2.0 millivolts.  Pacemaker pocket was then  irrigated with kanamycin solution.  Generator was sewn into the pocket.  Pocket was closed in 2 sequential layers with 2-0 Vicryl followed by 5-0  Vicryl.  Steri-Strips were placed.  The patient tolerated the procedure  well.   There were no apparent complications.  The patient will be discharged  to holding in stable condition.                                               Elmore Guise., M.D.    TWK/MEDQ  D:  06/07/2004  T:  06/09/2004  Job:  [id number]

## 2011-04-04 NOTE — Cardiovascular Report (Signed)
[place]  Patient:    Todd Davies, Todd Davies                       MRN: [id number] Proc. Date: 09/04/00 Adm. Date:  [id number] Attending:  [place], Peter Manning CC:         CVTS  Dr. Basilia Jumbo, [facility]   Cardiac Catheterization  INDICATIONS FOR PROCEDURE:  The patient is a 77 year old white male status post coronary artery bypass surgery who presents with a non-Q-wave myocardial infarction and refractory Class 4 angina.  ACCESS:  Right femoral artery using standard Seldinger technique.  EQUIPMENT:  The 6-French, 4-cm right and left Judkins catheter, a 6-French pigtail catheter, a 6-French arterial sheath.  CONTRAST:  Omnipaque, 145 cc.  MEDICATIONS:  Local anesthesia of 1% Xylocaine.  COMMENTARY:  The patient tolerated the procedure without complications.  HEMODYNAMIC DATA:  Aortic pressure is 110/60.  Left ventricular pressure is 112 with an EDP of 14 mmHg.  ANGIOGRAPHIC DATA: 1. The left coronary artery arises and distributes normally. 2. The left main coronary artery demonstrates a 60% stenosis distally. 3. The left anterior descending is heavily calcified.  It is occluded after    the first septal perforator. 4. The left circumflex coronary artery is diffusely diseased in the proximal    mid vessel to 50%.  At the takeoff of the second obtuse marginal vessel,    there is an 80-90% stenosis that is focal. 5. The right coronary is a codominant vessel.  It gives rise to a large    right ventricular marginal branch that comes off very proximally.    Following this, there is an 80-90% segmental stenosis in the right    coronary artery. 6. The saphenous vein graft to the right coronary artery is occluded    proximally. 7. The saphenous vein graft to the left anterior descending artery    demonstrates a 60% stenosis at the ostium.  This is followed by a severe    ulcerated 90% stenosis in the proximal vein graft.  This inserts  into the    LAD which is a large vessel that wraps around the apex.  There is also an    80-90% stenosis in the distal LAD after it wraps around the apex. 8. IMA angiography demonstrates a large, widely patent internal mammary artery    on the left.  Left ventricular angiography demonstrates normal left ventricular size and contractility with normal systolic function.  Ejection fraction is calculated at 70%.  There is no mitral regurgitation or prolapse.  FINAL INTERPRETATION: 1. Severe three-vessel obstructive atherosclerotic coronary artery disease. 2. Occluded saphenous vein graft to the right coronary artery. 3. Severely stenotic and ulcerated saphenous vein graft to the LAD. 4. Normal left ventricular function.  PLAN:  The patient needs re-do coronary artery bypass surgery. DD:  09/04/00 TD:  09/05/00 Job: [id number]

## 2011-04-04 NOTE — Op Note (Signed)
Todd Davies, Todd Davies NO.:  [id number]   MEDICAL RECORD NO.:  [id number]          PATIENT TYPE:  EMS   LOCATION:  MAJO                         FACILITY:  [facility]   PHYSICIAN:  Nadara Mustard, MD     DATE OF BIRTH:  1925-08-23   DATE OF PROCEDURE:  09/12/2005  DATE OF DISCHARGE:                                 OPERATIVE REPORT   EMERGENCY ROOM CONSULT AND PROCEDURE NOTE   HISTORY OF PRESENT ILLNESS:  The patient presents with an acute fall at home  landing on an outstretched right arm. The patient reports a history of  falling 1 week ago where he fell into the right foot. He has been having  pain over the base of the fifth metatarsal on the right foot. The patient  states that he does live with family at home; and has been having episodes  of falling. The patient also had a laceration to the lip which was sutured;  and was evaluated by the ER staff prior to evaluation.   PHYSICAL EXAMINATION:  Objective examination, the patient's both upper  extremities are neurovascularly intact. He has decreased range of motion of  the fingers on the right hand, but he can extend his thumb. The patient is  also tender to palpation of the base of the fifth metatarsal, right foot.  Radiographs shows a fracture interarticular base of the fifth metatarsal  right foot. Examination of the right wrist -- he has dorsal angulation  radiograph shows a distal, angulated, right, distal radius fracture.   ASSESSMENT:  1.  Base of fifth metatarsal fracture, right foot.  2.  Right distal radius fracture.   PLAN:  After informed consent and sterile prepping the patient underwent a  hematoma block with 10 mL of 1% lidocaine plain.  He then underwent a closed  reduction and was placed in a sugar-tong splint. The patient will follow up  in the office on Monday; call to make an appointment tomorrow. The patient  was given a prescription for Vicodin for pain; and given instructions for  rest, ice,  and elevation. Discussed that if the patient has loss of  reduction that we would need to proceed with open reduction internal  fixation. Followup on Monday as scheduled.      Nadara Mustard, MD  Electronically Signed     MVD/MEDQ  D:  09/11/2005  T:  09/12/2005  Job:  [id number]

## 2011-04-04 NOTE — Consult Note (Signed)
NAMEMarland Davies  Todd, Davies NO.:  [id number]   MEDICAL RECORD NO.:  [id number]                   PATIENT TYPE:  INP   LOCATION:  2018                                 FACILITY:  [facility]   PHYSICIAN:  Elmore Guise., M.D.           DATE OF BIRTH:  1924/12/23   DATE OF CONSULTATION:  06/04/2004  DATE OF DISCHARGE:                                   CONSULTATION   REASON FOR CONSULTATION:  Syncope.   CONSULTING PHYSICIAN:  Santiago Bumpers. Leveda Anna, M.D.   HISTORY OF PRESENT ILLNESS:  A 77 year old white male with past medical  history of coronary artery disease (status post CABG x2), paroxysmal atrial  fibrillation, hepatitis, and chronic resting tremor admitted to the hospital  after a syncopal episode.  The patient reports normal state of health  without exercising which he describes as walking at a brisk pace when he had  the acute onset of passing out.  He had no warning symptoms.  No  palpitations, no chest pain or shortness of breath, unknown period of loss  of consciousness.  On awakening, the patient noted significant bleeding from  his head and nose areas as well as significant bruising on his left forearm  and upper chest.  The patient denies any further problems, specifically  denies orthopnea, PND, lower extremity edema.  He reports compliance with  all his medications and denies any alcohol or drug use.   MEDICATIONS:  1. Coumadin.  2. Atenolol.  3. Zyprexa.  4. Valium.   ALLERGIES:  SULFA.   FAMILY HISTORY:  Noncontributory.   SOCIAL HISTORY:  He is widowed.  He denies tobacco or alcohol.   PHYSICAL EXAMINATION:  GENERAL APPEARANCE:  He is an elderly white male  alert and oriented x4 in no acute distress.  VITAL SIGNS:  Blood pressure 100 to 120/50 to 60, heart rate 60 to 120, O2  saturations are 97% on room air.  Weight is 125 pounds.  NECK:  Supple with no lymphadenopathy, 2+ carotids and no JVD.  LUNGS:  Clear.  CARDIOVASCULAR:  Regular  with occasional ectopic beat.  No significant  murmurs, rubs, or gallops.  ABDOMEN:  Soft, nontender and nondistended.  EXTREMITIES:  Warm with 2+ pulses and no edema.  SKIN:  Abrasion noted on the nose and left arm and forehead.   LABORATORY DATA:  TSH 2.5.  INR 2.1.  BUN and creatinine of 6 and 0.8.  Cardiac enzymes are negative.   Telemetry shows sinus rhythm with paroxysms of atrial fibrillation and  during his sinus episodes, he will have approximately 4 to 5 second pauses  followed by sinus tachycardia or atrial fibrillation.  Echo today showed EF  of 55 to 60%.  No wall motion abnormalities. He had mild MR and mild TR with  mild pulmonary hypertension.   IMPRESSION:  1. Syncope:  Most likely secondary to sick sinus syndrome (tachybrady).  2.  History of coronary artery disease, status post coronary artery bypass     grafting x2.  3. History of long-term anticoagulation.   PLAN:  The patient meets criteria for permanent pacemaker placement, would  recommend holding his atenolol or any AV nodal blockers at this time.  Once  his INR becomes less than 1.5, will schedule the patient for permanent  pacemaker placement.  This is tentatively scheduled for Friday afternoon due  to scheduling difficulties.  Recommend the patient continue with telemetry  monitoring until pacemaker is placed.  Of course, at this time, recommend  holding his Coumadin and with his atrial fibrillation paroxysms, he should  be bridged with either Lovenox or heparin or if not deemed an  anticoagulation candidate, then full dose aspirin would be adequate.   Please feel free to contact me should you have any further questions.                                               Elmore Guise., M.D.    TWK/MEDQ  D:  06/04/2004  T:  06/04/2004  Job:  [id number]

## 2011-04-04 NOTE — H&P (Signed)
NAME:  Todd Davies, Todd Davies NO.:  [id number]   MEDICAL RECORD NO.:  [id number]                   PATIENT TYPE:  INP   LOCATION:  2018                                 FACILITY:  [facility]   PHYSICIAN:  Adrian Blackwater, MD            DATE OF BIRTH:  01-02-1925   DATE OF ADMISSION:  06/01/2004  DATE OF DISCHARGE:                                HISTORY & PHYSICAL   CHIEF COMPLAINT:  Loss of consciousness.   HISTORY OF PRESENT ILLNESS:  This is a 77 year old white male patient with  history of syncopal episodes, three in the last two years. The patient  complained of dizziness off and on all these years. Today, he felt dizzy all  day, he felt he would get better if he went out and walked, but he just  blacked out and fell on the pavement hitting his left arm, nose, and  forehead. He remembered the episode up to the moment when he blacked out and  it was sudden.  He recovered fast after the episode, he did not have any  bowel incontinence nor bladder incontinence.   PAST MEDICAL HISTORY:  1. History of myocardial infarction times two.  2. Coronary artery disease.  3. History of benign prostatic hypertrophy.  4. Chronic prostatitis.  5. History of colon adenomatous polyps.  6. History of syncope.  7. History of atrial fibrillation.  8. History of hemorrhoids.  9. Rectal prolapse.   PAST SURGICAL HISTORY:  1. Coronary artery bypass graft in January 1978.  2. Coronary artery bypass graft redo in October 2001.  3. Echocardiogram with ejection fraction of 55% to 65%, mild MR, moderate TR     in August 2003.  4. Carotid Doppler revealed no sign of stenosis in August 2003.  5. Colonoscopy with polypectomy in November 2003.   ALLERGIES:  SULFA drugs and COMPAZINE.   MEDICATIONS:  1. Atenolol 25 mg b.i.d.  2. Coumadin 2.5 mg daily.  3. Pravachol 20 mg p.o. daily.  4. Senokot two tablets p.o. q.h.s.  5. Diazepam 5 mg t.i.d.  6. Zyprexa 2.5 mg p.o.  daily.   FAMILY HISTORY:  No family history of any psychiatric problems. Father died  at 23 years of age with myocardial infarction. Mother died at 83 years of  age with myocardial infarction/CHF.   SOCIAL HISTORY:  The patient is a widower with three children. Daughter is  53 years of age and sons 63 and 54 years of age. Lives with one of his sons.  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. He is a nonsmoker. He does  not use any drugs or alcohol.   PHYSICAL EXAMINATION:  VITAL SIGNS: Temperature 97.8, heart rate 92,  respiratory rate 24, blood pressure 141/85, oxygen saturation 96% on room  air.  GENERAL: This is a thin old man, very anxious and shaking after the syncopal  event. He has good  judgment and insight, and he is alert and oriented times  three at this moment.  HEENT: Normocephalic and shows abrasion of the skin on the left forearm,  nose, and forehead.  Pupils equal, round, and reactive to light. Intact  extraocular movements. Oropharynx without any abnormality. Edentulous.  NECK: Supple with no masses. Thyroid not palpable. No lymphadenopathy.  CARDIOVASCULAR: Distant irregular heart sounds. S1 and S2 present.  Peripheral pulses palpable.  LUNGS: Good respiratory effort. Normal vesicular breath sounds. No rales. No  crackles. No wheezes.  ABDOMEN: Flat, positive bowel sounds, nontender to palpation. No  hepatosplenomegaly.  EXTREMITIES: Symmetric; bruises on upper extremities due to capillary  fragility due to Coumadin use. No edema of the lower extremities. Right  lower leg, pretibial area small ulcer 1 x 2-cm diameter, in state of healing  with granulation tissue.  NEUROLOGIC: The patient is alert and oriented times three. Cranial nerves II-  XII intact. Good muscle tone and strength. Left upper extremities reveal  left arm decreased strength, good muscle tone.   LABORATORY AND X-RAY DATA:  On June 02, 2004, sodium 134, potassium 4,  chloride 103. PCO2 26,  BUN 6, creatinine 0.9. White blood cells 6.2,  hemoglobin 13, hematocrit 37.7, platelet count 145,000, calcium 8. PT 22.6,  INR 2.6. X-ray within normal limits.   ASSESSMENT/PLAN:  This is a 77 year old white male patient with syncopal  episodes times three, history of coronary artery disease, coronary artery  bypass graft times two in 1978 and 2001. We would like to rule out any new  arrhythmia or cardiac ischemia that may trigger the syncope. The patient on  this occasion did not have any palpitations, denies shortness of breath. We  will get old records to compare ECG findings.  Loss of consciousness episode  may be due to electrolyte disturbance, dehydration, but laboratory tests  came back normal. The patient is being well hydrated during the day. We will  check for orthostatics. There is no history of seizures, no history of  psychosis that may trigger the episode. We do not think it is a pulmonary  embolus. The patient has no history of deep venous thrombosis. He was on  Coumadin. There was no chest pain, no shortness of breath. There is no  history of drug overdose, but will check on __________ as possible  association with this episode.   We will repeat ECG in the morning, will check cardiac enzymes q.8h. times  three, order echocardiogram in the morning to rule out any new event on the  heart, and will check PT-INR in the morning at 7 a.m.                                                Adrian Blackwater, MD    IM/MEDQ  D:  06/02/2004  T:  06/03/2004  Job:  [id number]

## 2011-04-04 NOTE — Discharge Summary (Signed)
NAME:  [name], [name] NO.:  [id number]   MEDICAL RECORD NO.:  [id number]                   PATIENT TYPE:  INP   LOCATION:  2018                                 FACILITY:  [facility]   PHYSICIAN:  Rodolph Bong, M.D.                  DATE OF BIRTH:  04/18/1925   DATE OF ADMISSION:  06/01/2004  DATE OF DISCHARGE:  06/08/2004                                 DISCHARGE SUMMARY   PRIMARY CARE PHYSICIAN:  Anastasio Auerbach, [facility]   CARDIOLOGIST:  Dr. Peter Swaziland, [facility]   DISCHARGE DIAGNOSES:  1. Syncope likely related to paroxysmal atrial fibrillation.  2. Paroxysmal atrial fibrillation status post pacemaker placement this     hospitalization.  3. Hypertension.   DISCHARGE MEDICATIONS:  1. Atenolol 25 mg p.o. daily.  2. Pravachol 20 mg p.o. daily.  3. Valium 5 mg p.o. t.i.d.  4. Zyprexa 2.5 mg p.o. daily.  5. Coumadin 2.5 mg p.o. daily.   BRIEF HISTORY:  [name] is a 77 year old white male with a past medical  history of coronary artery disease who presented with a syncopal episode.  He states he has had about 3 of these in the last 2 years.  He does complain  of intermittent dizziness during the last few years.  He stated on the day  of arrival he was in his normal state of health and walking at a brisk pace  when he had the acute onset of syncope.  There were no warning symptoms.  He  denies any chest pain, shortness of breath or palpitations.  The patient did  report awakening to find bleeding from his head and nose and bruising on his  left forearm.  He was consequently admitted for further observation.   HOSPITAL COURSE:  1. Syncope.  Mr. Brunty was admitted with the history as outlined above.     With his past medical history the most likely etiology was tachybrady     syndrome and sick sinus.  However, this had never been demonstrated on     vent monitoring in the past.  Vital signs were stable in the  emergency     department. A CT scan of the head was negative.  He was consequently     admitted for further observation and telemetry monitoring.  On the     evening of June 04, 2003 Mr. Boike was noted to have episodes of     tachyarrhythmia with pauses of greater than 4 seconds.  At that time it     was deemed appropriate for permanent pacemaker placement per cardiology.     Consequently his Coumadin was held along with his beta blockers.  He     underwent permanent pacemaker placement on June 07, 2004.  He tolerated     the procedure well and will be discharged home on June 08, 2004.  Initially Coumadin had been held secondary to his significant syncopal     episodes and unknown etiology.  After his pacemaker had been placed it     was deemed that he could be restarted on his Coumadin therapy.  In     addition he will be restarted on Atenolol 25 mg p.o. daily for rate     control.   1. Paroxysmal atrial fibrillation status post pacemaker placement, please     see diagnosis number 1.   1. Hypertension - stable while in hospital.   PROCEDURES:  1. CAT scan of head which was within normal limits.  2. Lumbar sacral spine which was negative for fracture.  3. Left elbow series which was negative for fracture.  4. Permanent pacemaker placement on June 07, 2004.   DISCHARGE LABORATORY DATA:  White blood count of 6.0 thousand, hemoglobin  12.5, hematocrit 36, platelets 160,000.  Sodium 134, potassium 4.0, chloride  102, bicarb 27, BUN 10, creatinine 0.8, glucose 102, calcium, glucose 102,  calcium 8.1.   ACTIVITY:  No driving until reevaluated by cardiology.   DIET:  Heart Healthy diet which consists of low fat and low salt.   FOLLOW UP:  1. Dr. Swaziland, [facility].  The patient was advised to call on     Monday to set up an appointment within the next week.  In addition he was     advised to set up and INR check with them as they had been following his     Coumadin  prior to admission.   1. Dr. Hinda Glatter [facility] - please call [phone] to     set up an appointment in the next 1 to 2 weeks.   1. INR to be followed by cardiology.                                                Rodolph Bong, M.D.    AK/MEDQ  D:  06/08/2004  T:  06/09/2004  Job:  [id number]   cc:   Anastasio Auerbach, MD  Fax: [phone]   Peter M. Swaziland, M.D.  [address], [address]  [place], [place] [address]  Fax: [phone]

## 2011-04-04 NOTE — H&P (Signed)
NAME:  Todd Davies, LEVINSON NO.:  [id number]   MEDICAL RECORD NO.:  [id number]                   PATIENT TYPE:  INP   LOCATION:  4708                                 FACILITY:  [facility]   PHYSICIAN:  Nolon Nations, M.D.             DATE OF BIRTH:  10/28/1925   DATE OF ADMISSION:  06/28/2002  DATE OF DISCHARGE:                                HISTORY & PHYSICAL   HISTORY OF PRESENT ILLNESS:  The patient is a 77 year old man who presents  with a syncopal event.  He has a history of bypass in 1978, with a repeat  five-vessel bypass in 2001.  He passed out on Sunday evening while walking  out of the bathroom.  He woke up on the floor.  He felt dizzy and weak  afterwards.  He called his daughter and son.  On Monday, he had several  spells of syncopal type events, feeling dizzy, nauseous, weak.  Each lasted  about 5=10 minutes, and would remit for about 5-10 minutes.  He was given  Dramamine to help with his sleep.  He did feel more heart racing and  palpitations during the events yesterday and last evening.  He has had no  chest pain.   REVIEW OF SYSTEMS:  GENERAL:  Weak.  CARDIOVASCULAR:  No chest pain or  palpitations.  RESPIRATORY:  No shortness of breath, no cough.  GASTROINTESTINAL:  No recent diarrhea, constipation.  PSYCHOLOGICAL:  History of depression.  SKIN:  Has a lesion on his right lower leg that he  says has been nonhealing since his bypass.  EYES:  No vision.  ENT:  Poor  hearing bilaterally.  GENITOURINARY:  No changes.  MUSCULOSKELETAL:  Generally weak.  HEMATOLOGY:  Is on Coumadin.   PAST MEDICAL HISTORY:  1. Depression.  2. Hearing loss.  3. Old myocardial infarction.  4. Coronary artery disease.  5. Hemorrhoids.  6. Allergic rhinitis.  7. Capititis  8. BPH.  9. Prostatitis.  10.      Dermatitis.   CURRENT MEDICATIONS:  1. Atenolol25 mg b.i.d.  2. Coumadin 5 mg Monday and Friday, and 2.5 mg other days.  3. Docusate100 mg b.i.d.  4. Lanoxin 0.125 mg p.o. q.d.  5. Pravachol 10 mg p.o. q.d.  6. Valium 5 mg t.i.d.  7. Zyprexa 2.5 mg p.o. q.d.  8. Nitro-Dur p.r.n.   ALLERGIES:  SULFA.   SOCIAL HISTORY:  Widowed.  Has three children who are healthy.  Has a 9th  grade education.  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.  No drugs or  alcohol.   PHYSICAL EXAMINATION:  VITAL SIGNS:  Temp 97.3.  Blood pressure 110/72.  Weight 135.  Pulse 52.  GENERAL:  Well-developed, pleasant, cooperative male, in no acute distress.  HEENT:  Normocephalic, atraumatic.  EMI:  PERL.  TMs within normal limits.  Right TM with abundant cerumen.  Oropharynx with dentures.  No erythema.  NECK:  Supple, no masses, no thyromegaly, no lymphadenopathy.  LUNGS:  Clear to auscultation bilaterally.  CARDIAC:  Regular, bradycardia, irregular beat; no murmurs, no rubs, no  gallops.  EXTREMITIES:  Without edema.  GASTROINTESTINAL:  No masses, no tenderness to palpation.  No lesions..  Normal bowel sounds.  NEUROLOGICAL:  Cranial nerves II-XII intact.   LABORATORY DATA:  Pending   EKG,  Sinus brady with sinus arrhythmia, no ST-T wave inversions or Qs.  Does have some changes in the inferior leads compared to previous EKG of  July 2001, with poor R forces, QRs prolonged at 104.   ASSESSMENT/PLAN:  This is a 77 year old man with a history coronary artery  disease and coronary artery bypass grafting who presents with a syncopal  events.  Questionable inferior lead changes.   1. Syncope.  Cardiac presentation and is concerning for a tachybrady     syndrome or a block.  Especially with his history of bypass and these     anterior lead changes, needs evaluation.  Will admit to telemetry, hold     beta-blocker, rule out myocardial infarction with enzymes and EKG.  Will     contact Dr. Elvis Coil office, who will evaluate him in the morning.  He is     on Coumadin and digoxin for skipped beat..  No history of atrial fib     documented in our chart.  Will  discuss with Dr. Elvis Coil office.  Will     hold dig and check dig level for now.  2. Hypercholesterolemia.  Will continue Pravachol.  3. Depression.  Will continue Valium and Zyprexa.                                               Nolon Nations, M.D.    JTE/MEDQ  D:  06/28/2002  T:  07/01/2002  Job:  [id number]

## 2011-04-04 NOTE — Op Note (Signed)
NAMEGIORGIO, Todd Davies NO.:  [id number]   MEDICAL RECORD NO.:  [id number]          PATIENT TYPE:  AMB   LOCATION:  SDS                          FACILITY:  [facility]   PHYSICIAN:  Nadara Mustard, MD     DATE OF BIRTH:  1925/03/13   DATE OF PROCEDURE:  09/18/2005  DATE OF DISCHARGE:                                 OPERATIVE REPORT   PREOPERATIVE DIAGNOSIS:  Right distal radius fracture.   POSTOPERATIVE DIAGNOSIS:  Right distal radius fracture.   PROCEDURE:  Open reduction internal fixation, right distal radius.   SURGEON.:  Nadara Mustard, MD   ANESTHESIA:  General.   ESTIMATED BLOOD LOSS:  Minimal.   ANTIBIOTICS:  One gram of Kefzol.   DRAINS:  None.   COMPLICATIONS:  None.   DISPOSITION:  To PACU in stable condition.   INDICATION FOR PROCEDURE:  The patient is a 77 year old gentleman who fell,  sustaining a displaced right distal radius fracture.  He initially underwent  closed reduction; however, he had a failure of closed reduction with  displacement after reduction and presents at this time for open reduction  and internal fixation secondary to unstable right distal radius fracture.  Risks and benefits were discussed including infection, neurovascular injury,  persistent pain, need for additional surgery.  The patient states he  understands and wishes to proceed at this time.   DESCRIPTION OF PROCEDURE:  The patient was brought to OR room #5 and  underwent a general anesthetic.  After an adequate level of anesthesia, his  extremity was prepped with DuraPrep, draped into a sterile field and Ioban  was used to cover all exposed skin.  Anterior extensile approach of Sherilyn Cooter  was used and the vascular bundle was retracted radially.  The quadratus was  elevated off the distal radius.  The fracture site was freshened and this  was reduced.  This was then stabilized with a Synthes volar plate.  The mini  C-arm was used to verify reduction on both AP and lateral  planes.  The  tourniquet was not used; hemostasis was obtained throughout the case.  The  wound was  irrigated, vircryl and nylon were used to close the wound,  sterile Webril  and a Coban dressing was applied.  The patient was extubated and taken to  PACU in stable condition.  Plan for discharge to home.  Plan to follow up in  office in 2 weeks.      Nadara Mustard, MD  Electronically Signed     MVD/MEDQ  D:  09/18/2005  T:  09/19/2005  Job:  [id number]

## 2011-04-04 NOTE — H&P (Signed)
[place]  Patient:    Todd Davies, Todd Davies                       MRN: [id number] Adm. Date:  [id number] Attending:  Carmelina Peal CC:         Dr. Randolm Idol, family practice  Dr. Carlye Grippe   History and Physical  CHIEF COMPLAINT:  Chest pain.  HISTORY OF PRESENT ILLNESS:  Mr. Buffin is a 77 year old white male with a long history of coronary artery disease.  He is status post coronary artery bypass surgery x 2 in 1978 by Dr. Nedra Hai.  At that time, he had a saphenous vein graft placed to the right coronary artery, and a saphenous vein graft to the LAD. He was last evaluated with cardiac catheterization in March 1993.  This demonstrated 60-70% stenosis of the distal left main, a 50-60% stenosis of the mid and distal left circumflex, and an 80-90% stenosis in the mid to distal right coronary artery.  The right coronary artery was a small vessel with predominant supply of inferior wall being wrap around LAD.  Saphenous vein graft to the LAD was widely patent, but the saphenous vein graft to the right coronary was occluded.  He had normal left ventricular function.  Since that time, the patient has been treated medically, and has had stable anginal symptoms until this week.  On Sunday, the patient developed severe substernal chest pain radiating down his left arm that lasted three to four hours and then resolved.  He had mild chest pain yesterday morning, again, that resolved.  Today, at 8 a.m., patient developed severe substernal chest pain, rated 10/10, radiating down both arms and into his back.  This was unrelieved with two sublingual nitroglycerin and he presented to the emergency room where he did get relief with IV nitroglycerin.  PAST MEDICAL HISTORY:  Significant for chronic depression, hearing loss, coronary artery disease, has a history of dysfunctional rectum and has had multiple surgeries for this, and has had chronic constipation.  He has  a history of BPH, hypertension, and hemorrhoids.  He had an episode of atrial fibrillation in March 2001 that resolved.  He has been on chronic anticoagulation.  MEDICATIONS: 1. Antivert p.r.n. 2. Atenolol 25 mg b.i.d. 3. Coumadin 5 mg q.d. 4. Dilacor XR 240 mg q.d. 5. Colace 100 mg b.i.d. 6. Nitro-Dur patch 0.2 mg/hour topically q.d. 7. Valium 5 mg t.i.d. 8. Zyprexa 5 mg q.h.s. 9. Lithium 300 mg b.i.d.  ALLERGIES:  SULFA and COMPAZINE.  SOCIAL HISTORY:  Patient is a nonsmoker.  He does not drink.  He is a widower and lives with his son.  FAMILY HISTORY:  Father died at age 102 of myocardial infarction.  Mother died at age 90 with myocardial infarction.  REVIEW OF SYSTEMS:  He has chronic constipation and rectal pain.  He has difficulty sleeping.  He has depression, which has been stable.  PHYSICAL EXAMINATION:  GENERAL:  Patient is a thin white male in no apparent distress.  VITAL SIGNS:  Blood pressure 100/60, pulse 55, respirations 20, temperature afebrile.  HEENT:  Pupils are equal, round and reactive to light.  Extraocular movements are full.  Fundi are benign.  Oropharynx is clear.  NECK:  Without JVD, adenopathy, or bruits.  LUNGS:  Clear.  CARDIAC:  Reveals regular rate and rhythm with a grade 2/6 systolic murmur at the apex.  There is no S3.  ABDOMEN:  Soft,  nontender, without hepatosplenomegaly, masses, or bruits.  RECTAL:  Deferred.  EXTREMITIES:  Femoral and pedal pulses are 2+ and symmetric.  He has no edema.  NEUROLOGIC:  Intact.  LABORATORY DATA:  Chest x-ray shows mild interstitial edema with normal heart size.  ECG initially showed normal sinus rhythm with incomplete right bundle-branch block, otherwise normal.  Follow-up ECG an hour and 30 minutes later shows new T wave inversion anterolaterally.  White count 12,600; hemoglobin 13.5; hematocrit 39.2%; platelets 214,000. Sodium 143, potassium 4.1, chloride 105, BUN 8, creatinine 1.1, glucose  138. PT 21.4, INR 2.5, PTT 39.  CK is 133 with an MB of 8.3, troponin 0.46.  IMPRESSION: 1. Non-Q-wave myocardial infarction. 2. ASCAD, status post CABG x 2 in 1978, with known occlusion of the vein graft    in the right coronary artery in 1993. 3. Early CHF. 4. Depression. 5. History of atrial fibrillation, resolved. 6. Chronic anticoagulation. 7. Hypertension.  PLAN:  The patient be admitted to telemetry.  He will be treated with IV nitroglycerin and gentle diuresis.  Will hold his Coumadin.  Will continue his other anti-anginal medications.  Will add IV heparin once pro time is less than 2.  Will obtain serial cardiac enzymes and ECG.  Will check a lipid panel.  Will recommend cardiac catheterization once his pro time is decreased from his Coumadin. DD:  09/01/00 TD:  09/01/00 Job: [id number]

## 2012-09-04 IMAGING — CT CT HEAD W/O CM
1 series · 16 of 30 positions shown, 20 images · non-contrast
Comparison: none

CLINICAL DATA: Unresponsive, decreased appetite, recent fall

CT HEAD WITHOUT CONTRAST
TECHNIQUE: Contiguous axial images were obtained from the base of
the skull through the vertex without contrast.

[Series 2: head routine 4.8 h37s · axial · 0.46mm/px · z∈[+1103,+1238]mm · 16 of 30 slices shown, 20 images]
[im 2/30  brain]
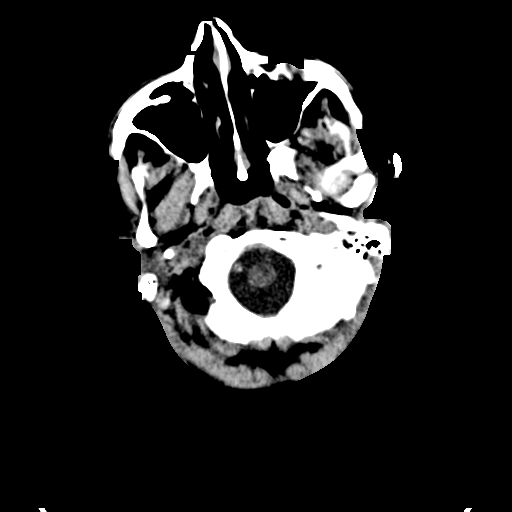
[im 2/30  bone]
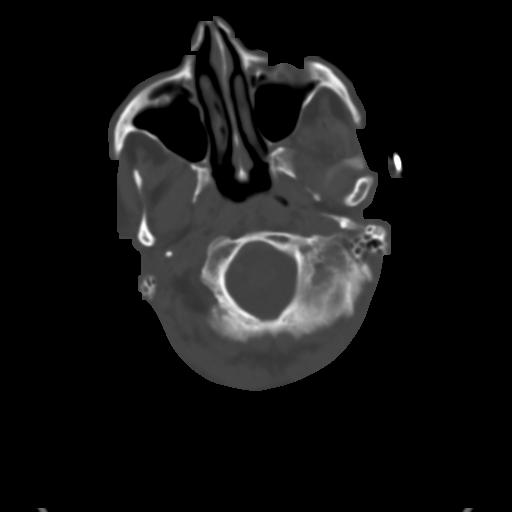
[im 4/30  brain]
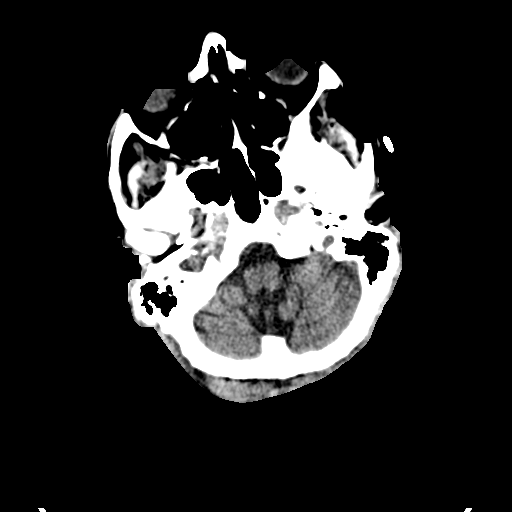
[im 6/30  brain]
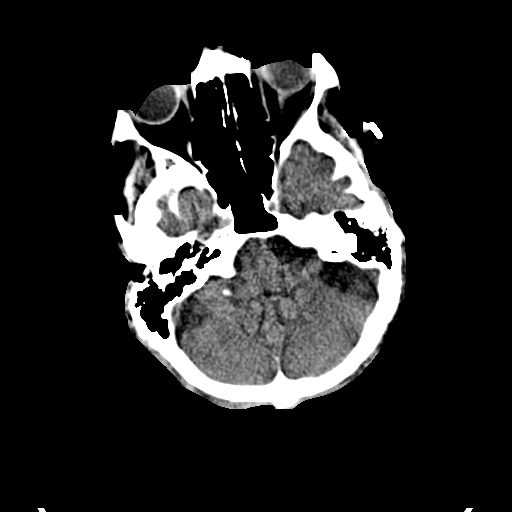
[im 8/30  brain]
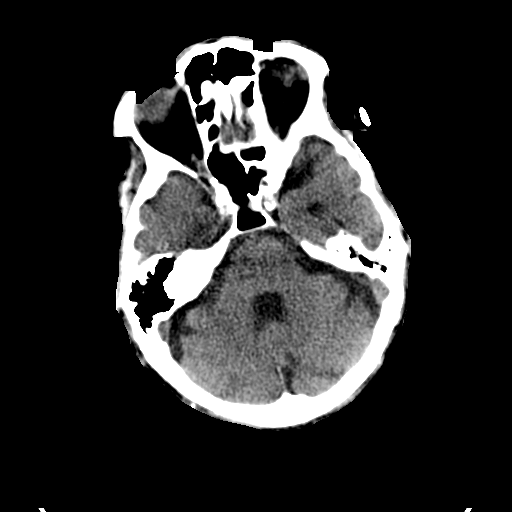
[im 9/30  brain]
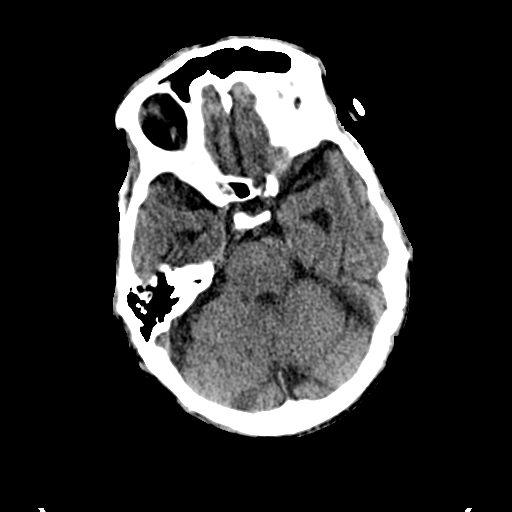
[im 9/30  bone]
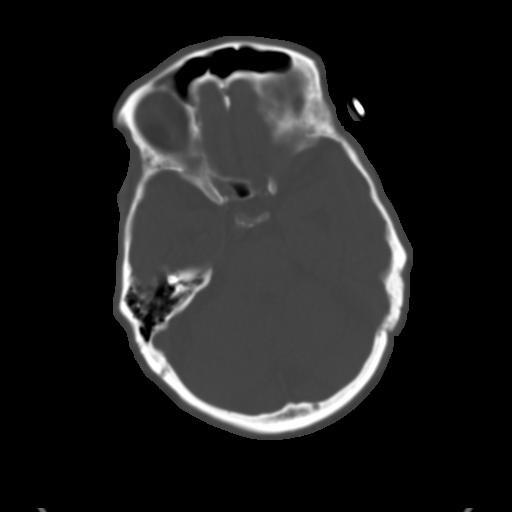
[im 11/30  brain]
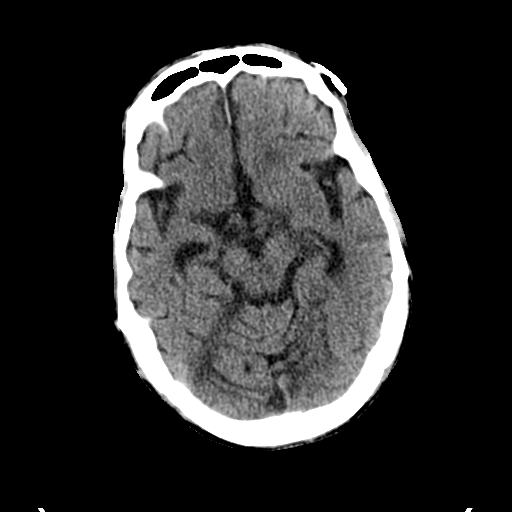
[im 13/30  brain]
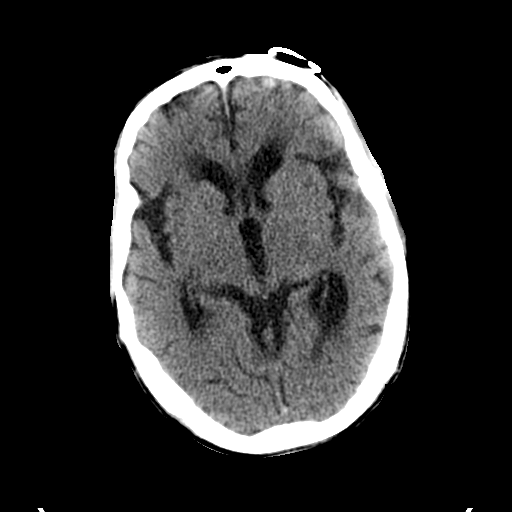
[im 15/30  brain]
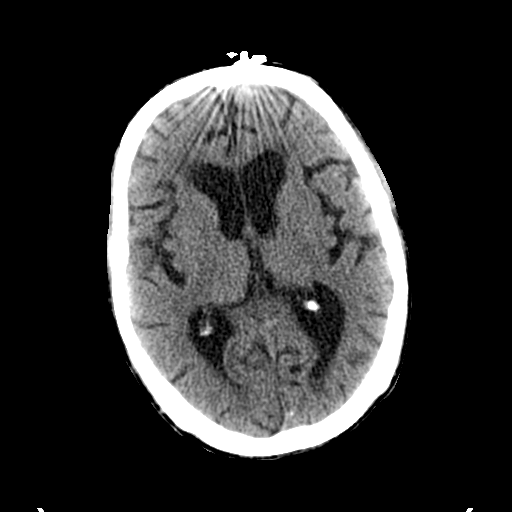
[im 16/30  brain]
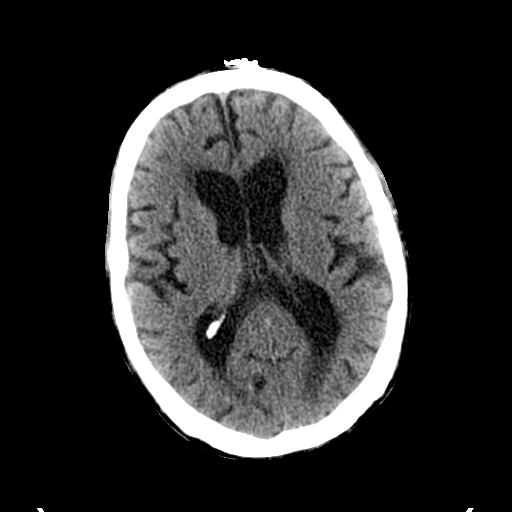
[im 16/30  bone]
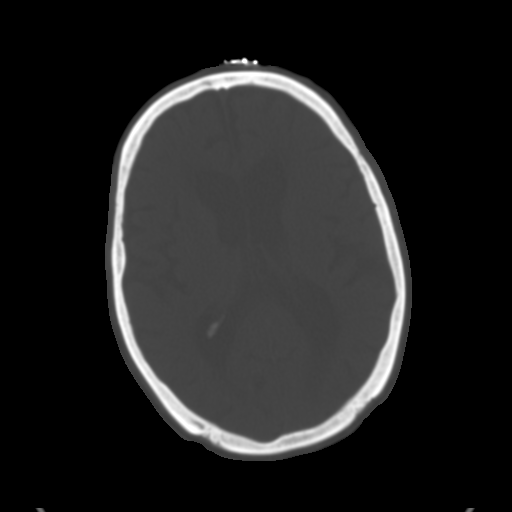
[im 18/30  brain]
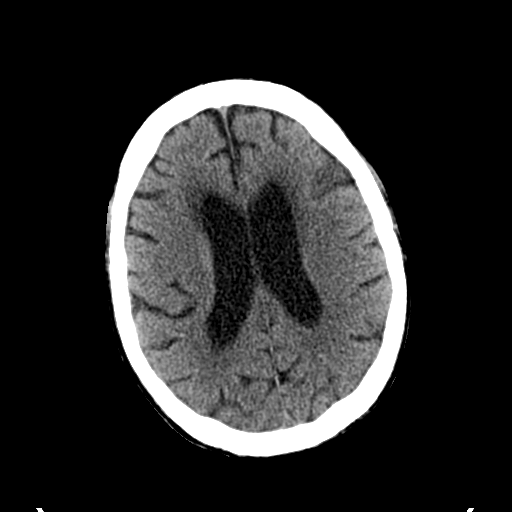
[im 20/30  brain]
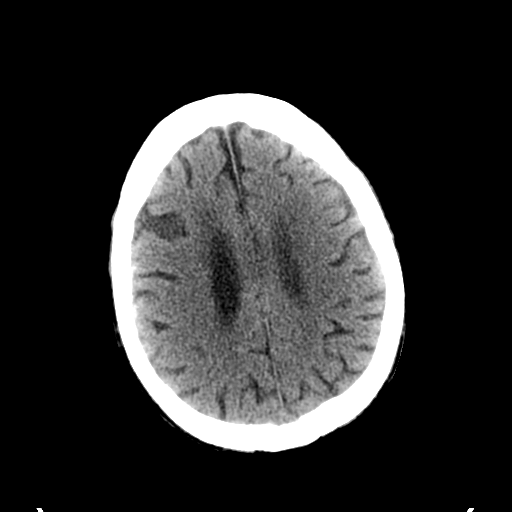
[im 22/30  brain]
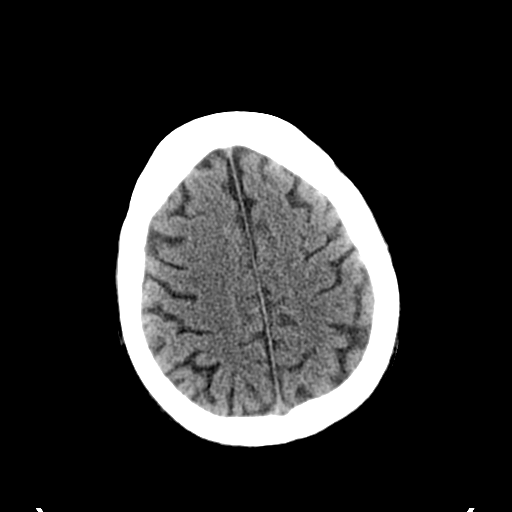
[im 23/30  brain]
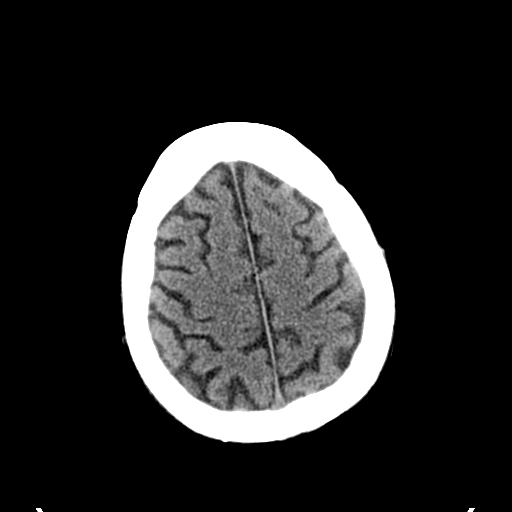
[im 23/30  bone]
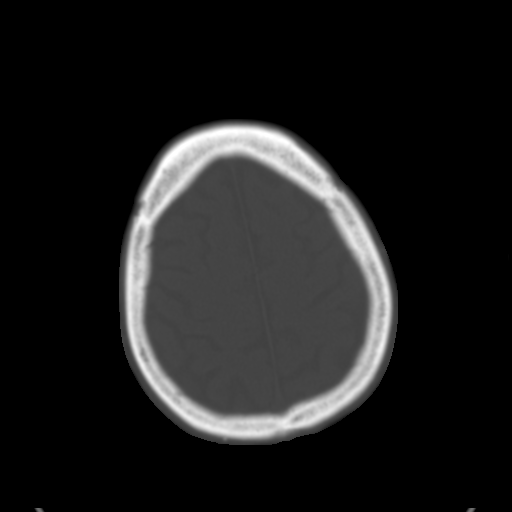
[im 25/30  brain]
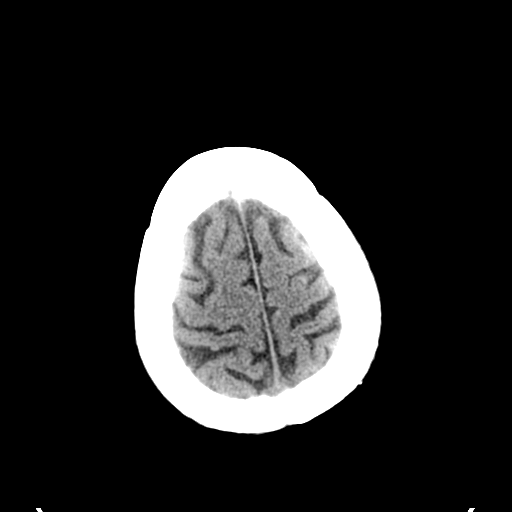
[im 27/30  brain]
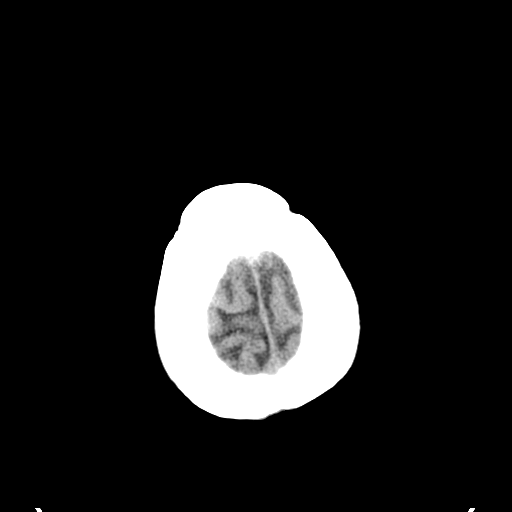
[im 29/30  brain]
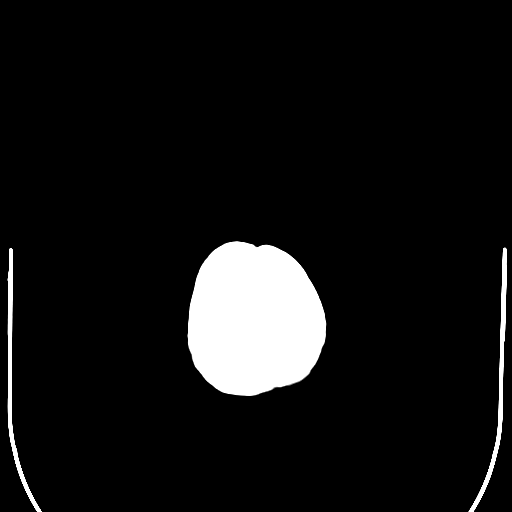

[16 of 30 positions shown; findings below may reference images not displayed]

FINDINGS: No acute intracranial hemorrhage.  No focal mass lesion.
No CT evidence of acute infarction.  There is generalized cortical
atrophy.  There is mild periventricular and subcortical white
matter hypodensities.

Review of the calvarium demonstrates no evidence of skull fracture.
No fluid in the paranasal sinuses mastoid air cells.
IMPRESSION: 1.  No evidence of intracranial trauma.
2.  No acute intracranial findings.
3.  Atrophy and microvascular disease are similar to prior.

## 2012-09-08 IMAGING — CR DG CHEST 1V PORT
1 series · 1 of 1 positions shown · non-contrast
Comparison: 09/16/2010

CLINICAL DATA: Pleural effusions, respiratory failure

PORTABLE CHEST - 1 VIEW

[view not recorded]
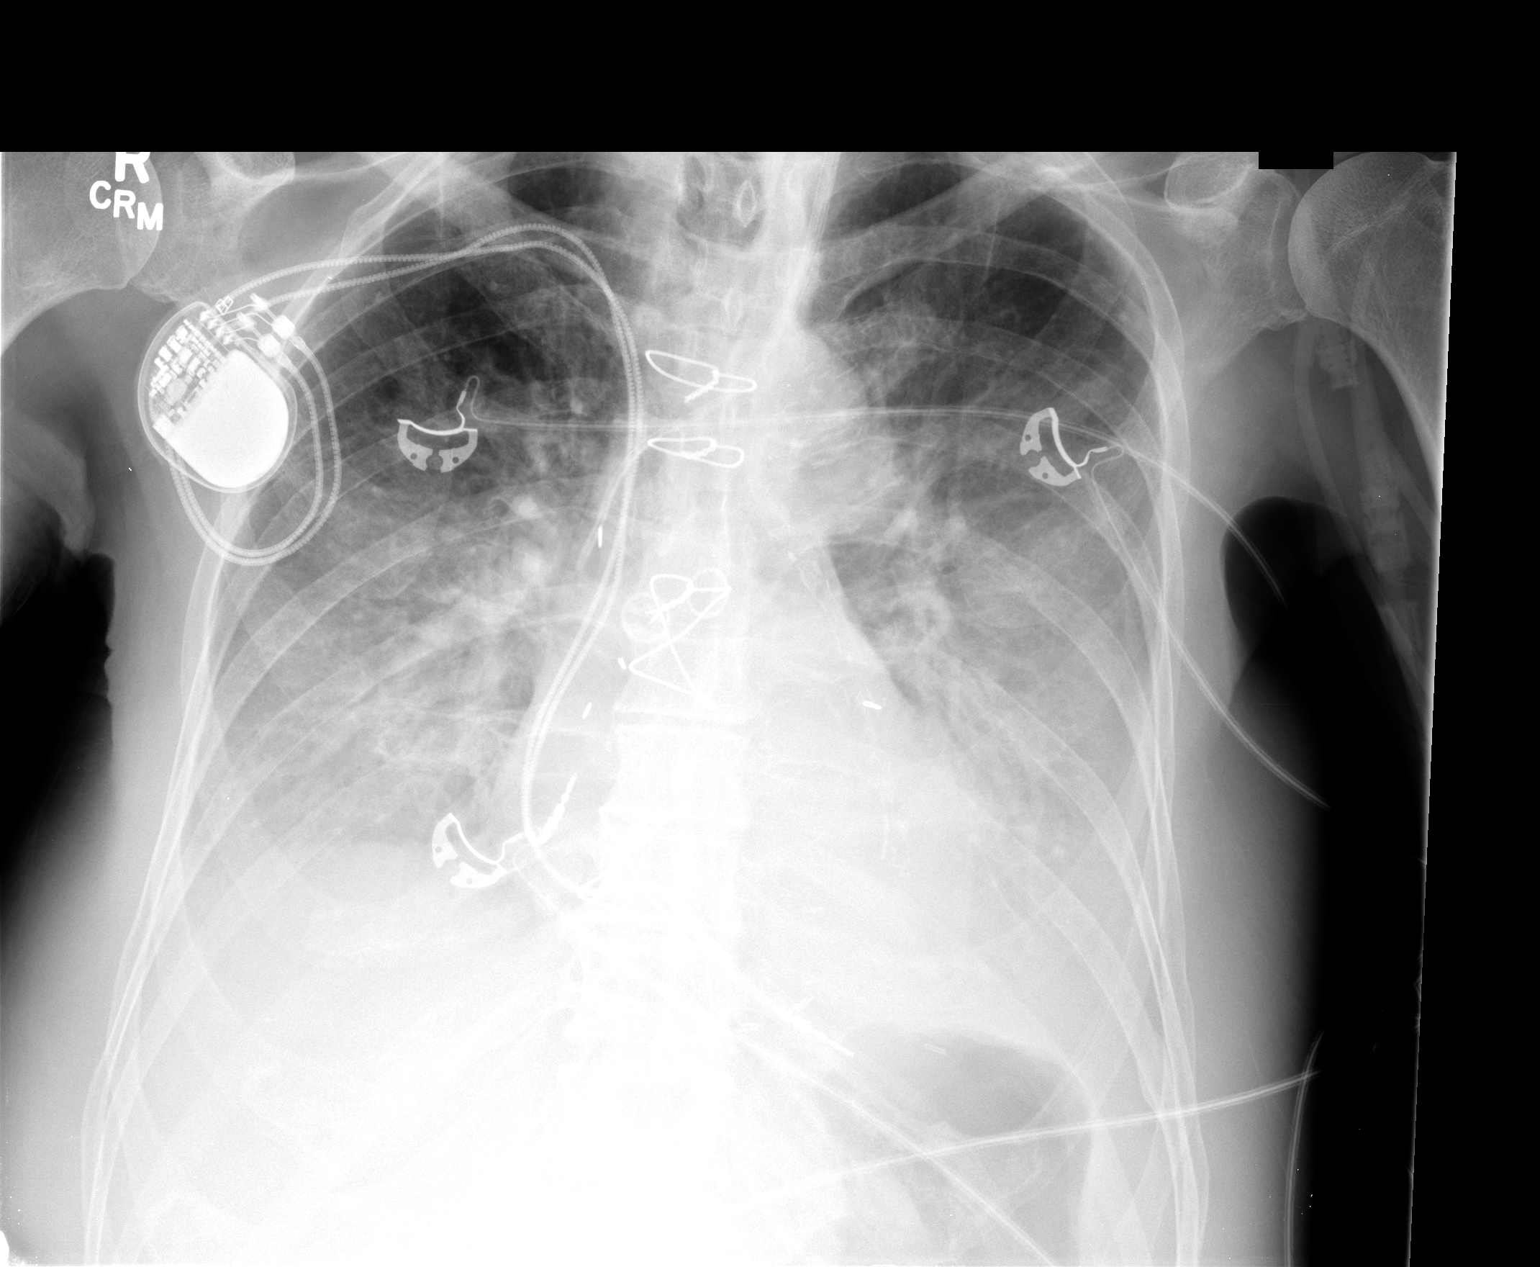

[1 of 1 positions shown; findings below may reference images not displayed]

FINDINGS: Stable postoperative changes from coronary bypass and
support apparatus.  Right subclavian pacer noted.  Pleural
effusions remain bilaterally, layering posteriorly with associated
basilar atelectasis.  No definite developing edema or pneumothorax.
IMPRESSION: Persistent bilateral pleural effusions and basilar atelectasis.
Stable exam.

## 2012-09-10 IMAGING — CR DG CHEST 1V PORT
1 series · 1 of 1 positions shown · non-contrast
Comparison: Chest radiograph 09/17/2010

CLINICAL DATA: Respiratory failure

PORTABLE CHEST - 1 VIEW

[AP]
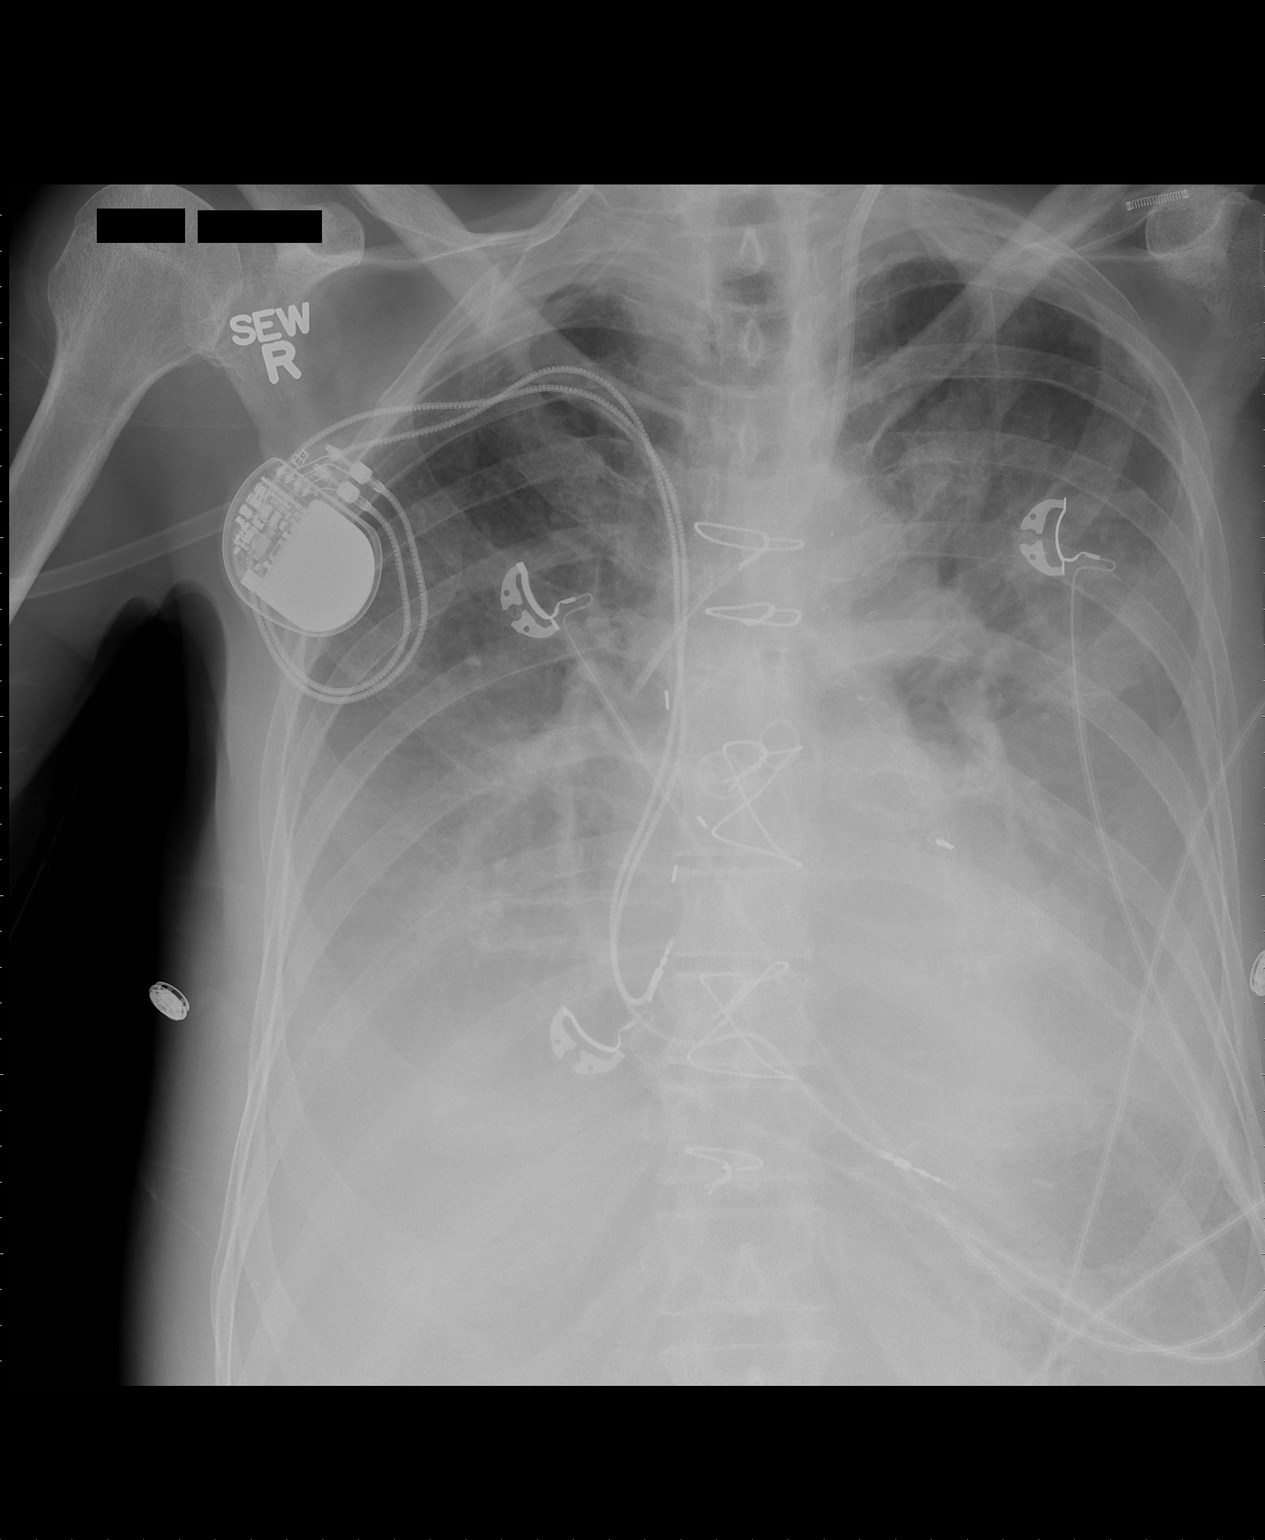

[1 of 1 positions shown; findings below may reference images not displayed]

FINDINGS: Sternotomy wires overlie stable cardiac silhouette.  The
left central venous line is unchanged.  Bilateral pleural effusions
which are increased in volume compared to prior.  Perihilar air
space disease pattern is unchanged.  No pneumothorax.
IMPRESSION: 1.  Interval increase in bilateral pleural effusions.
2.  The pulmonary edema pattern unchanged.

## 2022-07-11 NOTE — Progress Notes (Signed)
Erroneous encounter
